# Patient Record
Sex: Female | Born: 1980 | Race: White | Hispanic: No | Marital: Married | Smoking: Former smoker
Health system: Southern US, Community
[De-identification: ages and names within clinical notes are randomized; demographics above are authoritative.]

## PROBLEM LIST (undated history)

## (undated) DIAGNOSIS — D509 Iron deficiency anemia, unspecified: Secondary | ICD-10-CM

## (undated) DIAGNOSIS — M779 Enthesopathy, unspecified: Secondary | ICD-10-CM

## (undated) DIAGNOSIS — R000 Tachycardia, unspecified: Secondary | ICD-10-CM

## (undated) DIAGNOSIS — R079 Chest pain, unspecified: Secondary | ICD-10-CM

## (undated) DIAGNOSIS — N920 Excessive and frequent menstruation with regular cycle: Secondary | ICD-10-CM

## (undated) DIAGNOSIS — M545 Low back pain, unspecified: Secondary | ICD-10-CM

## (undated) DIAGNOSIS — E041 Nontoxic single thyroid nodule: Secondary | ICD-10-CM

## (undated) HISTORY — DX: Iron deficiency anemia, unspecified: D50.9

## (undated) HISTORY — PX: BUNIONECTOMY: SHX129

## (undated) HISTORY — DX: Excessive and frequent menstruation with regular cycle: N92.0

## (undated) HISTORY — DX: Low back pain, unspecified: M54.50

## (undated) HISTORY — PX: VAGINAL HYSTERECTOMY: SUR661

## (undated) HISTORY — DX: Chest pain, unspecified: R07.9

## (undated) HISTORY — PX: LASIK: SHX215

## (undated) HISTORY — DX: Enthesopathy, unspecified: M77.9

## (undated) HISTORY — PX: LAPAROSCOPIC ASSISTED VAGINAL HYSTERECTOMY: SHX5398

## (undated) HISTORY — DX: Nontoxic single thyroid nodule: E04.1

## (undated) HISTORY — DX: Tachycardia, unspecified: R00.0

---

## 1999-06-03 ENCOUNTER — Other Ambulatory Visit: Admission: RE | Admit: 1999-06-03 | Discharge: 1999-06-03 | Payer: Self-pay | Admitting: *Deleted

## 2000-10-05 ENCOUNTER — Other Ambulatory Visit: Admission: RE | Admit: 2000-10-05 | Discharge: 2000-10-05 | Payer: Self-pay | Admitting: Obstetrics and Gynecology

## 2001-04-06 ENCOUNTER — Inpatient Hospital Stay (HOSPITAL_COMMUNITY): Admission: AD | Admit: 2001-04-06 | Discharge: 2001-04-08 | Payer: Self-pay | Admitting: Obstetrics and Gynecology

## 2001-04-14 ENCOUNTER — Inpatient Hospital Stay (HOSPITAL_COMMUNITY): Admission: AD | Admit: 2001-04-14 | Discharge: 2001-04-15 | Payer: Self-pay | Admitting: Obstetrics and Gynecology

## 2002-09-10 ENCOUNTER — Other Ambulatory Visit: Admission: RE | Admit: 2002-09-10 | Discharge: 2002-09-10 | Payer: Self-pay | Admitting: Obstetrics and Gynecology

## 2002-11-17 ENCOUNTER — Other Ambulatory Visit: Admission: RE | Admit: 2002-11-17 | Discharge: 2002-11-17 | Payer: Self-pay | Admitting: Obstetrics and Gynecology

## 2003-10-08 ENCOUNTER — Other Ambulatory Visit: Admission: RE | Admit: 2003-10-08 | Discharge: 2003-10-08 | Payer: Self-pay | Admitting: Obstetrics and Gynecology

## 2004-04-14 ENCOUNTER — Other Ambulatory Visit: Admission: RE | Admit: 2004-04-14 | Discharge: 2004-04-14 | Payer: Self-pay | Admitting: Obstetrics and Gynecology

## 2004-08-29 ENCOUNTER — Other Ambulatory Visit: Admission: RE | Admit: 2004-08-29 | Discharge: 2004-08-29 | Payer: Self-pay | Admitting: Obstetrics and Gynecology

## 2004-11-13 ENCOUNTER — Inpatient Hospital Stay (HOSPITAL_COMMUNITY): Admission: AD | Admit: 2004-11-13 | Discharge: 2004-11-13 | Payer: Self-pay | Admitting: Obstetrics and Gynecology

## 2005-04-21 ENCOUNTER — Inpatient Hospital Stay (HOSPITAL_COMMUNITY): Admission: RE | Admit: 2005-04-21 | Discharge: 2005-04-24 | Payer: Self-pay | Admitting: Obstetrics and Gynecology

## 2005-05-01 ENCOUNTER — Inpatient Hospital Stay (HOSPITAL_COMMUNITY): Admission: AD | Admit: 2005-05-01 | Discharge: 2005-05-01 | Payer: Self-pay | Admitting: Obstetrics and Gynecology

## 2005-06-01 ENCOUNTER — Other Ambulatory Visit: Admission: RE | Admit: 2005-06-01 | Discharge: 2005-06-01 | Payer: Self-pay | Admitting: Obstetrics and Gynecology

## 2008-10-01 ENCOUNTER — Encounter: Admission: RE | Admit: 2008-10-01 | Discharge: 2008-10-01 | Payer: Self-pay | Admitting: Family Medicine

## 2009-10-21 ENCOUNTER — Encounter: Admission: RE | Admit: 2009-10-21 | Discharge: 2009-10-21 | Payer: Self-pay | Admitting: Internal Medicine

## 2010-05-27 ENCOUNTER — Other Ambulatory Visit: Payer: Self-pay | Admitting: Endocrinology

## 2010-05-27 DIAGNOSIS — E049 Nontoxic goiter, unspecified: Secondary | ICD-10-CM

## 2010-08-05 NOTE — Discharge Summary (Signed)
Sydney Ramsey, Sydney Ramsey              ACCOUNT NO.:  [id number]   MEDICAL RECORD NO.:  [id number]          PATIENT TYPE:  INP   LOCATION:  9115                          FACILITY:  [facility]   PHYSICIAN:  Freddy Finner, M.D.   DATE OF BIRTH:  [birth year]   DATE OF ADMISSION:  04/21/2005  DATE OF DISCHARGE:  04/24/2005                                 DISCHARGE SUMMARY   ADMISSION DIAGNOSES:  1.  Pregnancy at term,  2.  Breech presentation.   DISCHARGE DIAGNOSES:  1.  Status post low transverse cesarean section,  2.  A viable female infant.   PROCEDURE:  Primary low transverse cesarean section.   REASON FOR ADMISSION:  Please see dictated H&P.   HOSPITAL COURSE:  The patient is a 30 year old white married female, gravida  2, para 1, that was admitted to [facility] for scheduled  cesarean section. The patient has had a breech presentation which was  confirmed by ultrasound on admission. On the morning of admission the  patient was taken to operating room where spinal anesthesia was administered  without difficulty. A low transverse incision was made with delivery of a  viable female infant weighing 6 pounds, 4 ounces. Apgar's of 8 at 1 minute  and 9 at 5 minutes. Arterial cord pH was 7.28. The patient tolerated  procedure well and was taken to the recovery room in stable condition. On  postoperative day #1 the patient was without complaint. Vital signs were  stable. She was afebrile. Abdomen soft. Fundus firm and nontender, abdominal  dressing was noted to be clean, dry and intact.   LABORATORY DATA:  Hemoglobin 7.2, platelet count 143,000, WBC count of 7.5  thousand.   On postoperative day #2 the patient was without complaint. Vital signs  remained stable. She was afebrile. Abdomen soft with good return of bowel  function. Fundus firm and nontender. Incision was clean, dry and intact. The  patient was tolerating a regular diet without complaints of nausea and  vomiting.  The patient was now started on some iron supplementation. On  postoperative day #3 the patient was without complaint. Vital signs remained  stable. Fundus firm and nontender. Incision was clean, dry and intact,  staples removed and the patient was discharged home.   CONDITION ON DISCHARGE:  Stable diet regular as tolerated.   ACTIVITY:  No heavy lifting, no driving x2 weeks, no vaginal entry.   FOLLOW UP:  Patient to follow up in the office in 1-2 weeks for incision  check. She is to call for temperature greater than 100 degrees persistent,  vomiting, heavy vaginal bleeding and/or redness or drainage from incisional  site.   DISCHARGE MEDICATIONS:  1.  Percocet 5/325 #30 one p.o. q.4-6 hours p.r.n. pain.  2.  Motrin 600 mg q.6 hours.  3.  Reglan 10 mg one p.o. t.i.d. x7 days for increase in milk supply.  4.  Prenatal vitamins one p.o. daily.      Julio Sicks, N.P.      Freddy Finner, M.D.  Electronically Signed    CC/MEDQ  D:  05/30/2005  T:  05/30/2005  Job:  [id number]

## 2010-08-05 NOTE — Discharge Summary (Signed)
[facility]  Patient:    Sydney Ramsey, Sydney Ramsey Visit Number: [id number] MRN: [id number]          Service Type: GYN Location: 910A 9116 [id number] Attending Physician:  Frederich Balding Dictated by:   Marcelle Overlie, M.D. Admit Date:  04/13/2001 Discharge Date: 04/15/2001                             Discharge Summary  ADMISSION DIAGNOSIS:  Endomyometritis postpartum.  DISCHARGE DIAGNOSIS:  Endomyometritis postpartum.  HISTORY OF PRESENT ILLNESS AND HOSPITAL COURSE:  The patient is a 30 year old gravida 1, para 1, status post vaginal delivery on 04/06/01, who presented to the office complaining of fever and abdominal pain and foul discharge.  On admission, the patients temperature was 100.2.  She was noted to have moderate uterine tenderness, and a white blood cell count was 15.7 with a left shift.  The patient was admitted, and placed on IV Unasyn.  Within two days the patients pain had resolved.  She had been afebrile for over 24 hours. The patient was discharged home on hospital day #3, with Augmentin 250 mg t.i.d. x7 days.  CONDITION ON DISCHARGE:  Good.  She was advised to call if she has any temperature greater than 100.5, or return of her abdominal pain.  FOLLOWUP:  In our office for her postpartum visit. Dictated by:   Marcelle Overlie, M.D. Attending Physician:  Frederich Balding DD:  04/29/01 TD:  04/29/01 Job: [id number]

## 2010-08-05 NOTE — H&P (Signed)
NAMEJENIN, [name], M.D. DATE OF BIRTH:  Feb 06, 1981   DATE OF ADMISSION:  04/21/2005  DATE OF DISCHARGE:                                HISTORY & PHYSICAL   CHIEF COMPLAINT:  Breech.   HISTORY OF PRESENT ILLNESS:  This patient is a 30 year old, married white  female, G2, P83 with an EDC of April 29, 2005, who has a breech baby,  confirmed on ultrasound.  Prenatal care has been complicated by a two-vessel  cord.  Anatomy was otherwise normal.  She has a history of a degenerated  disk as well.  After discussing the options, she is being admitted for  cesarean section.  Ultrasound in the office on April 05, 2005, was  consistent with breech and an estimated fetal weight of 5 pounds 15 ounces  and amniotic fluid index of 12.3 in the 40th percentile.  Potential risks  and complications have been reviewed preoperatively.   Past medical history, past surgical history, family history, obstetric  history, see prenatal history and physical.   MEDICATIONS:  Prenatal vitamins, no known drug allergies.   SOCIAL HISTORY:  Denies tobacco, alcohol, or drug abuse.   REVIEW OF SYSTEMS:  NEUROLOGIC:  Denies headache.  CARDIAC:  Denies chest  pain.  PULMONARY:  Denies shortness of breath.   PHYSICAL EXAMINATION:  VITAL SIGNS:  Height 5 feet 3 inches, weight 198  pounds, blood pressure 120/74.  HEENT:  Without thyromegaly.  LUNGS:  Clear to auscultation.  HEART:  Regular rate and rhythm.  BREASTS:  Not examined.  ABDOMEN:  Gravid, nontender.  PELVIC:  Cervix is 1, thick and high, breech.  EXTREMITIES:  Grossly within normal limits.  NEUROLOGIC EXAM:  Grossly within normal limits.   ASSESSMENT:  Intrauterine pregnancy at 38-6/7 weeks, breech.   PLAN:  Cesarean section.      Guy Sandifer Henderson Cloud,  M.D.  Electronically Signed    JET/MEDQ  D:  04/19/2005  T:  04/19/2005  Job:  [id number]

## 2010-08-05 NOTE — Op Note (Signed)
NAMESHARRA, Sydney Ramsey              ACCOUNT NO.:  [id number]   MEDICAL RECORD NO.:  [id number]          PATIENT TYPE:  INP   LOCATION:  9199                          [facility]ACILITY:  [facility]   PHYSICIAN:  Guy Sandifer. Henderson Cloud, M.D. DATE OF BIRTH:  Sep 06, 1980   DATE OF PROCEDURE:  04/21/2005  DATE OF DISCHARGE:                                 OPERATIVE REPORT   PREOPERATIVE DIAGNOSIS:  Breech, at term.   POSTOPERATIVE DIAGNOSIS:  Breech, at term.   PROCEDURE:  Low transverse cesarean section.   SURGEON:  Guy Sandifer. Henderson Cloud, M.D.   ANESTHESIA:  Spinal.   ESTIMATED BLOOD LOSS:  800 mL.   FINDINGS:  A viable female infant, Apgars of 8 and 9 at one and five  minutes, respectively.  Birth weight pending. Arterial cord pH 7.28.   INDICATIONS AND CONSENT:  This patient is a 30 year old married white  female, G2, P24, with an EDC of April 29, 2005, with breech confirmed on  ultrasound.  Cesarean section has been discussed.  The potential risks and  complications have been discussed preoperatively including but limited to  infection, bowel, bladder or ureteral damage, bleeding requiring transfusion  of blood products with possible transfusion reaction, HIV and hepatitis  acquisition, DVT, PE, and pneumonia.  All questions have been answered and  consent is signed on the chart.   PROCEDURE:  The patient is taken to operating room, where she is identified,  a spinal anesthetic is placed, and she is placed in the dorsal supine  position with a 15 degrees left lateral wedge.  She was then prepped, a  Foley catheter was placed in the bladder to drain, and  she is draped in a  sterile fashion.  After testing for adequate spinal anesthesia, the skin is  entered through a Pfannenstiel incision.  Dissection is carried out layers  to the peritoneum.  The peritoneum was incised, extended superiorly and  inferiorly.  The vesicouterine peritoneum is taken down cephalolaterally and  a bladder flap is and the  bladder blade is placed.  The uterus is incised in  a low transverse manner and the uterine cavity is entered bluntly with a  hemostat.  The uterine incision is then extended cephalolaterally with the  fingers.  The breech is delivered and the remainder the baby is delivered  without difficulty.  There is a single nuchal cord. T A good cry and tone is  noted.  The cord is clamped and cut and the baby is handed to the awaiting  pediatrics team.  Placenta is manually delivered. The uterus is clean.  There is a small extension in the lower uterine segment to the left of the  midline.  This is repaired with 0 Monocryl in a locking fashion.  The  uterine incision is then closed in a running locking suture with an  imbricating layer placed as well.  There is a small bleeder at the distal  point of the lower uterine incision extension.  With a hand placed behind  the broad ligament for palpation, a single 2-0 chromic suture was placed,  which obtained good  hemostasis.  The anterior peritoneum is then closed in a  running fashion with 0 Monocryl suture, which is also used to reapproximate  the pyramidalis muscle in  the midline.  The fascia is closed in a running fashion with 0 PDS suture  and the skin is closed with clips.  All sponge, instrument and needle counts  are correct, and the patient is transferred to the recovery room in stable  condition.      Guy Sandifer Henderson Cloud, M.D.  Electronically Signed     JET/MEDQ  D:  04/21/2005  T:  04/21/2005  Job:  [id number]

## 2010-09-29 ENCOUNTER — Other Ambulatory Visit: Payer: Self-pay | Admitting: Obstetrics and Gynecology

## 2010-09-30 ENCOUNTER — Ambulatory Visit
Admission: RE | Admit: 2010-09-30 | Discharge: 2010-09-30 | Disposition: A | Discharge status: Home / Self Care | Payer: Private Health Insurance - Indemnity | Source: Ambulatory Visit | Attending: Obstetrics and Gynecology | Admitting: Obstetrics and Gynecology

## 2010-11-22 ENCOUNTER — Other Ambulatory Visit: Payer: Self-pay

## 2010-11-23 ENCOUNTER — Ambulatory Visit
Admission: RE | Admit: 2010-11-23 | Discharge: 2010-11-23 | Disposition: A | Discharge status: Home / Self Care | Payer: Private Health Insurance - Indemnity | Source: Ambulatory Visit | Attending: Endocrinology | Admitting: Endocrinology

## 2010-11-23 DIAGNOSIS — E049 Nontoxic goiter, unspecified: Secondary | ICD-10-CM

## 2012-07-09 ENCOUNTER — Other Ambulatory Visit: Payer: Self-pay | Admitting: Endocrinology

## 2012-07-09 DIAGNOSIS — E049 Nontoxic goiter, unspecified: Secondary | ICD-10-CM

## 2012-07-10 ENCOUNTER — Ambulatory Visit
Admission: RE | Admit: 2012-07-10 | Discharge: 2012-07-10 | Disposition: A | Discharge status: Home / Self Care | Payer: BC Managed Care – PPO | Source: Ambulatory Visit | Attending: Endocrinology | Admitting: Endocrinology

## 2012-07-10 DIAGNOSIS — E049 Nontoxic goiter, unspecified: Secondary | ICD-10-CM

## 2013-06-13 ENCOUNTER — Ambulatory Visit (INDEPENDENT_AMBULATORY_CARE_PROVIDER_SITE_OTHER): Payer: 59 | Admitting: Family Medicine

## 2013-06-13 ENCOUNTER — Telehealth: Payer: Self-pay | Admitting: Family Medicine

## 2013-06-13 ENCOUNTER — Encounter: Payer: Self-pay | Admitting: Family Medicine

## 2013-06-13 VITALS — BP 106/69 | HR 70 | Temp 99.0°F | Resp 16 | Ht 63.0 in | Wt 164.0 lb

## 2013-06-13 DIAGNOSIS — Z0000 Encounter for general adult medical examination without abnormal findings: Secondary | ICD-10-CM

## 2013-06-13 LAB — COMPREHENSIVE METABOLIC PANEL
ALK PHOS: 45 U/L (ref 39–117)
ALT: 17 U/L (ref 0–35)
AST: 17 U/L (ref 0–37)
Albumin: 4.2 g/dL (ref 3.5–5.2)
BILIRUBIN TOTAL: 0.5 mg/dL (ref 0.3–1.2)
BUN: 16 mg/dL (ref 6–23)
CHLORIDE: 104 meq/L (ref 96–112)
CO2: 25 meq/L (ref 19–32)
Calcium: 9.6 mg/dL (ref 8.4–10.5)
Creatinine, Ser: 0.8 mg/dL (ref 0.4–1.2)
GFR: 85.98 mL/min (ref >60.00)
GLUCOSE: 86 mg/dL (ref 70–99)
POTASSIUM: 4.1 meq/L (ref 3.5–5.1)
SODIUM: 135 meq/L (ref 135–145)
TOTAL PROTEIN: 7.3 g/dL (ref 6.0–8.3)

## 2013-06-13 LAB — CBC WITH DIFFERENTIAL/PLATELET
BASOS PCT: 0.5 % (ref 0.0–3.0)
Basophils Absolute: 0 10*3/uL (ref 0.0–0.1)
EOS PCT: 1.9 % (ref 0.0–5.0)
Eosinophils Absolute: 0.1 10*3/uL (ref 0.0–0.7)
HEMATOCRIT: 34.7 % — AB (ref 36.0–46.0)
Hemoglobin: 11.4 g/dL — ABNORMAL LOW (ref 12.0–15.0)
Lymphocytes Relative: 31.5 % (ref 12.0–46.0)
Lymphs Abs: 1.7 10*3/uL (ref 0.7–4.0)
MCHC: 32.8 g/dL (ref 30.0–36.0)
MCV: 76.6 fl — AB (ref 78.0–100.0)
MONO ABS: 0.4 10*3/uL (ref 0.1–1.0)
Monocytes Relative: 7.5 % (ref 3.0–12.0)
Neutro Abs: 3.1 10*3/uL (ref 1.4–7.7)
Neutrophils Relative %: 58.6 % (ref 43.0–77.0)
Platelets: 219 10*3/uL (ref 150.0–400.0)
RBC: 4.52 Mil/uL (ref 3.87–5.11)
RDW: 14.9 % — ABNORMAL HIGH (ref 11.5–14.6)
WBC: 5.3 10*3/uL (ref 4.5–10.5)

## 2013-06-13 LAB — LIPID PANEL
CHOL/HDL RATIO: 3
Cholesterol: 173 mg/dL (ref 0–200)
HDL: 53.1 mg/dL (ref >39.00)
LDL CALC: 108 mg/dL — AB (ref 0–99)
TRIGLYCERIDES: 61 mg/dL (ref 0.0–149.0)
VLDL: 12.2 mg/dL (ref 0.0–40.0)

## 2013-06-13 LAB — TSH: TSH: 0.81 u[IU]/mL (ref 0.35–5.50)

## 2013-06-13 MED ORDER — TAZAROTENE 0.05 % EX CREA: TOPICAL_CREAM | CUTANEOUS | Status: DC

## 2013-06-13 NOTE — Telephone Encounter (Signed)
Left message to tell patient medication was sent to [facility].

## 2013-06-13 NOTE — Telephone Encounter (Signed)
Patient forgot to ask you if you could refill her acne / face cream which is generic tazorac 0.05% in a 30 gram tube.  Patient uses cream BID.  Please advise.

## 2013-06-13 NOTE — Telephone Encounter (Signed)
Rx sent to pharmacy (this comes in brand name only).

## 2013-06-13 NOTE — Progress Notes (Signed)
Office Note 06/13/2013  CC:  Chief Complaint  Patient presents with  . Establish Care    fasting     HPI:  Sydney Ramsey is a 33 y.o. White female who is here to establish care. Patient's most recent primary MD: Brooks Sailors in [place]--has been a while. ENdo is Dr. Talmage Nap.  GYN is [name]. Old records were reviewed prior to or during today's visit.  Last CPE about 2 yrs ago--all labs were ok then. Feeling well now.  Past Medical History  Diagnosis Date  . Thyroid nodule     Has been euthyroid  . Iron deficiency anemia     Hx of; has heavy menses.  . Menorrhagia     [name]    Past Surgical History  Procedure Laterality Date  . Bunionectomy Left   . Cesarean section      Family History  Problem Relation Age of Onset  . Alzheimer's disease Paternal Grandmother   . Heart attack Paternal Grandfather     History   Social History  . Marital Status: Married    Spouse Name: N/A    Number of Children: N/A  . Years of Education: N/A   Occupational History  . Not on file.   Social History Main Topics  . Smoking status: Former Smoker    Quit date: 06/14/2010  . Smokeless tobacco: Never Used  . Alcohol Use: 1.2 oz/week    2 Cans of beer per week     Comment: twice weekly  . Drug Use: No  . Sexual Activity: Not on file   Other Topics Concern  . Not on file   Social History Narrative   Married, 1 son 12 yrs and 1 daughter 81yrs.   Occupation: stay at home mom.   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   Lives in [place].   Tob: 5 pack-yr hx, quit 2012.   Alc: occasional beer.   No drugs.   Exercise: 3-4 days a week : running and/or aerobics.    Outpatient Encounter Prescriptions as of 06/13/2013  Medication Sig  . levothyroxine (SYNTHROID, LEVOTHROID) 100 MCG tablet Take 100 mcg by mouth daily before breakfast.    No Known Allergies  ROS Review of Systems  Constitutional: Negative for fever, chills, appetite change  and fatigue.  HENT: Negative for congestion, dental problem, ear pain and sore throat.   Eyes: Negative for discharge, redness and visual disturbance.  Respiratory: Negative for cough, chest tightness, shortness of breath and wheezing.   Cardiovascular: Negative for chest pain, palpitations and leg swelling.  Gastrointestinal: Negative for nausea, vomiting, abdominal pain, diarrhea and blood in stool.  Genitourinary: Negative for dysuria, urgency, frequency, hematuria, flank pain and difficulty urinating.  Musculoskeletal: Negative for arthralgias, back pain, joint swelling, myalgias and neck stiffness.  Skin: Negative for pallor and rash.  Neurological: Negative for dizziness, speech difficulty, weakness and headaches.  Hematological: Negative for adenopathy. Does not bruise/bleed easily.  Psychiatric/Behavioral: Negative for confusion and sleep disturbance. The patient is not nervous/anxious.     PE; Blood pressure 106/69, pulse 70, temperature 99 F (37.2 C), temperature source Temporal, resp. rate 16, height 5\' 3"  (1.6 m), weight 164 lb (74.39 kg), last menstrual period 05/22/2013, SpO2 98.00%. Gen: Alert, well appearing.  Patient is oriented to person, place, time, and situation. AFFECT: pleasant, lucid thought and speech. ENT: Ears: EACs clear, normal epithelium.  TMs with good light reflex and landmarks bilaterally.  Eyes: no injection,  icteris, swelling, or exudate.  EOMI, PERRLA. Nose: no drainage or turbinate edema/swelling.  No injection or focal lesion.  Mouth: lips without lesion/swelling.  Oral mucosa pink and moist.  Dentition intact and without obvious caries or gingival swelling.  Oropharynx without erythema, exudate, or swelling.  Neck: supple/nontender.  No LAD, mass, or TM.  Carotid pulses 2+ bilaterally, without bruits. CV: RRR, no m/r/g.   LUNGS: CTA bilat, nonlabored resps, good aeration in all lung fields. ABD: soft, NT, ND, BS normal.  No hepatospenomegaly or mass.   No bruits. EXT: no clubbing, cyanosis, or edema.  Musculoskeletal: no joint swelling, erythema, warmth, or tenderness.  ROM of all joints intact. Skin - no sores or suspicious lesions or rashes or color changes  Pertinent labs:  None today  ASSESSMENT AND PLAN:   New pt: no old records to obtain.  Health maintenance examination Reviewed age and gender appropriate health maintenance issues (prudent diet, regular exercise, health risks of tobacco and excessive alcohol, use of seatbelts, fire alarms in home, use of sunscreen).  Also reviewed age and gender appropriate health screening as well as vaccine recommendations. She is UTD with vaccines, breast ca screening, and cervical ca screening (through GYN). Health panel labs drawn today.   An After Visit Summary was printed and given to the patient.  Return in about 1 year (around 06/14/2014) for annual CPE with fasting labs the week prior.

## 2013-06-13 NOTE — Assessment & Plan Note (Signed)
Reviewed age and gender appropriate health maintenance issues (prudent diet, regular exercise, health risks of tobacco and excessive alcohol, use of seatbelts, fire alarms in home, use of sunscreen).  Also reviewed age and gender appropriate health screening as well as vaccine recommendations. She is UTD with vaccines, breast ca screening, and cervical ca screening (through GYN). Health panel labs drawn today.

## 2013-07-03 ENCOUNTER — Encounter: Payer: Self-pay | Admitting: Family Medicine

## 2013-07-03 ENCOUNTER — Ambulatory Visit (HOSPITAL_BASED_OUTPATIENT_CLINIC_OR_DEPARTMENT_OTHER)
Admission: RE | Admit: 2013-07-03 | Discharge: 2013-07-03 | Disposition: A | Discharge status: Home / Self Care | Payer: 59 | Source: Ambulatory Visit | Attending: Family Medicine | Admitting: Family Medicine

## 2013-07-03 ENCOUNTER — Ambulatory Visit (INDEPENDENT_AMBULATORY_CARE_PROVIDER_SITE_OTHER): Payer: 59 | Admitting: Family Medicine

## 2013-07-03 VITALS — BP 116/79 | HR 61 | Temp 98.8°F | Resp 18 | Ht 63.0 in | Wt 164.0 lb

## 2013-07-03 DIAGNOSIS — R0789 Other chest pain: Secondary | ICD-10-CM

## 2013-07-03 DIAGNOSIS — R5383 Other fatigue: Secondary | ICD-10-CM

## 2013-07-03 DIAGNOSIS — R079 Chest pain, unspecified: Secondary | ICD-10-CM

## 2013-07-03 DIAGNOSIS — R5381 Other malaise: Secondary | ICD-10-CM

## 2013-07-03 LAB — TSH: TSH: 0.43 u[IU]/mL (ref 0.35–5.50)

## 2013-07-03 LAB — CBC WITH DIFFERENTIAL/PLATELET
Basophils Absolute: 0 10*3/uL (ref 0.0–0.1)
Basophils Relative: 0.8 % (ref 0.0–3.0)
EOS ABS: 0.1 10*3/uL (ref 0.0–0.7)
EOS PCT: 1.5 % (ref 0.0–5.0)
HCT: 35.7 % — ABNORMAL LOW (ref 36.0–46.0)
Hemoglobin: 11.7 g/dL — ABNORMAL LOW (ref 12.0–15.0)
LYMPHS PCT: 30.1 % (ref 12.0–46.0)
Lymphs Abs: 1.7 10*3/uL (ref 0.7–4.0)
MCHC: 32.7 g/dL (ref 30.0–36.0)
MCV: 76.6 fl — ABNORMAL LOW (ref 78.0–100.0)
MONO ABS: 0.3 10*3/uL (ref 0.1–1.0)
MONOS PCT: 6 % (ref 3.0–12.0)
NEUTROS PCT: 61.6 % (ref 43.0–77.0)
Neutro Abs: 3.6 10*3/uL (ref 1.4–7.7)
PLATELETS: 246 10*3/uL (ref 150.0–400.0)
RBC: 4.65 Mil/uL (ref 3.87–5.11)
RDW: 14.9 % — AB (ref 11.5–14.6)
WBC: 5.8 10*3/uL (ref 4.5–10.5)

## 2013-07-03 LAB — COMPREHENSIVE METABOLIC PANEL
ALBUMIN: 3.8 g/dL (ref 3.5–5.2)
ALK PHOS: 52 U/L (ref 39–117)
ALT: 18 U/L (ref 0–35)
AST: 19 U/L (ref 0–37)
BUN: 10 mg/dL (ref 6–23)
CALCIUM: 9.4 mg/dL (ref 8.4–10.5)
CO2: 30 mEq/L (ref 19–32)
CREATININE: 0.7 mg/dL (ref 0.4–1.2)
Chloride: 103 mEq/L (ref 96–112)
GFR: 101.72 mL/min (ref >60.00)
Glucose, Bld: 78 mg/dL (ref 70–99)
POTASSIUM: 3.9 meq/L (ref 3.5–5.1)
SODIUM: 139 meq/L (ref 135–145)
TOTAL PROTEIN: 7.2 g/dL (ref 6.0–8.3)
Total Bilirubin: 0.8 mg/dL (ref 0.3–1.2)

## 2013-07-03 NOTE — Progress Notes (Signed)
OFFICE NOTE  07/03/2013  CC:  Chief Complaint  Patient presents with  . Chest Pain    while running   . Fatigue  . Nausea     HPI: Patient is a 33 y.o. Caucasian female who is here for chest pain.  "I haven't felt well for a couple of weeks. Last week had episode of central chest squeezing while running on treadmill, went away when she started walking.  Happened again when running with dog.  Again, this week happened when running again.  No nausea or diaphoresis.   Achy in chest afterwards.  Out of breath going up stairs recently.  Rested for a day or two.  Felt better yesterday.  Woke up today felt a bit queezy, ate a banana and drank coffee but no distinct nausea and no vomiting, also some pain under right ribs.  Legs felt weak.  She got somewhat tremulous.  Felt some palp's today but none during recent exercise induced chest pains.  Lots more perspiration under arms lately.  The chest pains have occurred after exercising approx 10 min or so.  When she slowed down, the discomfort went away after 1-2 min.  Noted no wheezing.  Tightness in chest was noted but wasn't unusually SOB.  Denies hx of EIB or asthmatic sx's in past.  No recent coughing, no fever, no persistent HAs.  No significant allergic rhinitis.    Pertinent PMH:  Past Medical History  Diagnosis Date  . Thyroid nodule     Has been euthyroid  . Iron deficiency anemia     Hx of; has heavy menses.  . Menorrhagia     Dr. Henderson Cloudomblin   Past Surgical History  Procedure Laterality Date  . Bunionectomy Left   . Cesarean section     History   Social History Narrative   Married, 1 son 12 yrs and 1 daughter 5060yrs.   Occupation: stay at home mom.   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   Lives in [place].   Tob: 5 pack-yr hx, quit 2012.   Alc: occasional beer.   No drugs.   Exercise: 3-4 days a week : running and/or aerobics.    MEDS:  Outpatient Prescriptions Prior to Visit  Medication Sig  Dispense Refill  . levothyroxine (SYNTHROID, LEVOTHROID) 100 MCG tablet Take 100 mcg by mouth daily before breakfast.      . tazarotene (TAZORAC) 0.05 % cream Apply to affected area bid  60 g  5   No facility-administered medications prior to visit.    PE: Blood pressure 116/79, pulse 61, temperature 98.8 F (37.1 C), temperature source Temporal, resp. rate 18, height 5\' 3"  (1.6 m), weight 164 lb (74.39 kg), last menstrual period 06/17/2013, SpO2 99.00%. Gen: Alert, well appearing.  Patient is oriented to person, place, time, and situation. ZOX:WRUEENT:Eyes: no injection, icteris, swelling, or exudate.  EOMI, PERRLA. Mouth: lips without lesion/swelling.  Oral mucosa pink and moist. Oropharynx without erythema, exudate, or swelling.  Neck - No masses or limitation in range of motion.  She has diffuse thyromegaly w/out tenderness  (c/w previous exams/dx) CV: RRR, no m/r/g.   LUNGS: CTA bilat, nonlabored resps, good aeration in all lung fields. No chest wall tenderness. ABD: soft, NT, ND, BS normal.  No hepatospenomegaly or mass.  No bruits. EXT: no clubbing, cyanosis, or edema.   LAB: 12 lead EKG today showed sinus bradycardia, rate 56, nonspecific T wave changes. No prior EKG available for comparison.  IMPRESSION  AND PLAN:  Atypical chest pain W/u here reassuring. Check CBC, CMET, TSH, and CXR. Working dx at this time is exercise induced bronchospasm.  No therapy recommended at this time, though. Watchful waiting approach discussed with pt.   An After Visit Summary was printed and given to the patient.  FOLLOW UP: prn

## 2013-07-03 NOTE — Assessment & Plan Note (Signed)
W/u here reassuring. Check CBC, CMET, TSH, and CXR. Working dx at this time is exercise induced bronchospasm.  No therapy recommended at this time, though. Watchful waiting approach discussed with pt.

## 2013-07-03 NOTE — Progress Notes (Signed)
Pre visit review using our clinic review tool, if applicable. No additional management support is needed unless otherwise documented below in the visit note. 

## 2013-07-04 ENCOUNTER — Ambulatory Visit: Payer: 59

## 2013-07-04 DIAGNOSIS — D509 Iron deficiency anemia, unspecified: Secondary | ICD-10-CM

## 2013-07-04 LAB — FERRITIN: FERRITIN: 6.4 ng/mL — AB (ref 10.0–291.0)

## 2013-07-04 LAB — IBC PANEL
IRON: 67 ug/dL (ref 42–145)
SATURATION RATIOS: 13 % — AB (ref 20.0–50.0)
Transferrin: 368.9 mg/dL — ABNORMAL HIGH (ref 212.0–360.0)

## 2013-07-07 ENCOUNTER — Encounter: Payer: Self-pay | Admitting: Family Medicine

## 2013-09-15 ENCOUNTER — Other Ambulatory Visit: Payer: Self-pay | Admitting: Endocrinology

## 2013-09-15 DIAGNOSIS — E049 Nontoxic goiter, unspecified: Secondary | ICD-10-CM

## 2013-09-17 ENCOUNTER — Ambulatory Visit
Admission: RE | Admit: 2013-09-17 | Discharge: 2013-09-17 | Disposition: A | Discharge status: Home / Self Care | Payer: 59 | Source: Ambulatory Visit | Attending: Endocrinology | Admitting: Endocrinology

## 2013-09-17 DIAGNOSIS — E049 Nontoxic goiter, unspecified: Secondary | ICD-10-CM

## 2014-03-20 HISTORY — PX: LAPAROSCOPIC ASSISTED VAGINAL HYSTERECTOMY: SHX5398

## 2014-06-15 ENCOUNTER — Encounter: Payer: 59 | Admitting: Family Medicine

## 2014-06-30 ENCOUNTER — Other Ambulatory Visit: Payer: Self-pay | Admitting: Obstetrics and Gynecology

## 2014-06-30 DIAGNOSIS — R928 Other abnormal and inconclusive findings on diagnostic imaging of breast: Secondary | ICD-10-CM

## 2014-07-03 ENCOUNTER — Other Ambulatory Visit: Payer: Self-pay | Admitting: Obstetrics and Gynecology

## 2014-07-03 ENCOUNTER — Ambulatory Visit
Admission: RE | Admit: 2014-07-03 | Discharge: 2014-07-03 | Disposition: A | Discharge status: Home / Self Care | Payer: BLUE CROSS/BLUE SHIELD | Source: Ambulatory Visit | Attending: Obstetrics and Gynecology | Admitting: Obstetrics and Gynecology

## 2014-07-03 ENCOUNTER — Other Ambulatory Visit: Payer: 59

## 2014-07-03 DIAGNOSIS — R928 Other abnormal and inconclusive findings on diagnostic imaging of breast: Secondary | ICD-10-CM

## 2014-07-07 ENCOUNTER — Ambulatory Visit (INDEPENDENT_AMBULATORY_CARE_PROVIDER_SITE_OTHER): Payer: BLUE CROSS/BLUE SHIELD | Admitting: Family Medicine

## 2014-07-07 ENCOUNTER — Encounter: Payer: Self-pay | Admitting: Family Medicine

## 2014-07-07 VITALS — BP 111/74 | HR 65 | Temp 98.8°F | Resp 18 | Ht 63.0 in | Wt 169.0 lb

## 2014-07-07 DIAGNOSIS — D509 Iron deficiency anemia, unspecified: Secondary | ICD-10-CM | POA: Diagnosis not present

## 2014-07-07 DIAGNOSIS — Z0000 Encounter for general adult medical examination without abnormal findings: Secondary | ICD-10-CM | POA: Diagnosis not present

## 2014-07-07 LAB — COMPREHENSIVE METABOLIC PANEL
ALBUMIN: 4.2 g/dL (ref 3.5–5.2)
ALT: 15 U/L (ref 0–35)
AST: 16 U/L (ref 0–37)
Alkaline Phosphatase: 49 U/L (ref 39–117)
BUN: 13 mg/dL (ref 6–23)
CALCIUM: 9.1 mg/dL (ref 8.4–10.5)
CHLORIDE: 105 meq/L (ref 96–112)
CO2: 28 meq/L (ref 19–32)
CREATININE: 0.71 mg/dL (ref 0.40–1.20)
GFR: 99.48 mL/min (ref >60.00)
Glucose, Bld: 81 mg/dL (ref 70–99)
POTASSIUM: 4.1 meq/L (ref 3.5–5.1)
Sodium: 137 mEq/L (ref 135–145)
TOTAL PROTEIN: 6.7 g/dL (ref 6.0–8.3)
Total Bilirubin: 0.7 mg/dL (ref 0.2–1.2)

## 2014-07-07 LAB — LIPID PANEL
Cholesterol: 150 mg/dL (ref 0–200)
HDL: 41.2 mg/dL (ref >39.00)
LDL CALC: 93 mg/dL (ref 0–99)
NONHDL: 108.8
Total CHOL/HDL Ratio: 4
Triglycerides: 78 mg/dL (ref 0.0–149.0)
VLDL: 15.6 mg/dL (ref 0.0–40.0)

## 2014-07-07 LAB — CBC WITH DIFFERENTIAL/PLATELET
BASOS PCT: 0.5 % (ref 0.0–3.0)
Basophils Absolute: 0 10*3/uL (ref 0.0–0.1)
EOS PCT: 2.9 % (ref 0.0–5.0)
Eosinophils Absolute: 0.1 10*3/uL (ref 0.0–0.7)
HCT: 39.4 % (ref 36.0–46.0)
HEMOGLOBIN: 13.3 g/dL (ref 12.0–15.0)
Lymphocytes Relative: 30.3 % (ref 12.0–46.0)
Lymphs Abs: 1.5 10*3/uL (ref 0.7–4.0)
MCHC: 33.7 g/dL (ref 30.0–36.0)
MCV: 83.8 fl (ref 78.0–100.0)
Monocytes Absolute: 0.4 10*3/uL (ref 0.1–1.0)
Monocytes Relative: 7.5 % (ref 3.0–12.0)
NEUTROS ABS: 2.9 10*3/uL (ref 1.4–7.7)
NEUTROS PCT: 58.8 % (ref 43.0–77.0)
Platelets: 201 10*3/uL (ref 150.0–400.0)
RBC: 4.7 Mil/uL (ref 3.87–5.11)
RDW: 12.6 % (ref 11.5–15.5)
WBC: 5 10*3/uL (ref 4.0–10.5)

## 2014-07-07 NOTE — Progress Notes (Signed)
Pre visit review using our clinic review tool, if applicable. No additional management support is needed unless otherwise documented below in the visit note. 

## 2014-07-07 NOTE — Progress Notes (Signed)
Office Note 07/07/2014  CC:  Chief Complaint  Patient presents with  . Annual Exam    FASTING    HPI:  Sydney Ramsey is a 34 y.o. White female who is here for annual health maintenance exam.   Is followed by Dr. Talmage Nap for her hx of thyroid nodule, is on T4 for suppression and pt says levels have been "fine" and f/u ultrasounds annually by Dr. Talmage Nap have been unchanged.  Recent lump seen on mammo/us ob R breast and she goes for bx tomorrow.  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   For her menorrhagia and hx of iron def anemia from this, she and [name] are considering a hysterectomy (endo bx normal, no fibroids present either) this summer.   Paps UTD via GYN.  Gets eye exam annually: no probs. Gets dental exams/cleaning q6 mo.   Past Medical History  Diagnosis Date  . Thyroid nodule     Has been euthyroid  . Iron deficiency anemia     Hx of; has heavy menses  (low iron/ferritin 06/2013)  . Menorrhagia     [name]    Past Surgical History  Procedure Laterality Date  . Bunionectomy Left   . Cesarean section    . Lasik  2008 approx    Family History  Problem Relation Age of Onset  . Alzheimer's disease Paternal Grandmother   . Heart attack Paternal Grandfather     History   Social History  . Marital Status: Married    Spouse Name: N/A  . Number of Children: N/A  . Years of Education: N/A   Occupational History  . Not on file.   Social History Main Topics  . Smoking status: Former Smoker    Quit date: 06/14/2010  . Smokeless tobacco: Never Used  . Alcohol Use: 1.2 oz/week    2 Cans of beer per week     Comment: twice weekly  . Drug Use: No  . Sexual Activity: Not on file   Other Topics Concern  . Not on file   Social History Narrative   Married, has 1 son and 1 daughter.   Occupation: stay at home mom.   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   Lives in [place].   Tob: 5  pack-yr hx, quit 2012.   Alc: occasional beer.   No drugs.   Exercise: 3-4 days a week : running and/or aerobics.    Outpatient Prescriptions Prior to Visit  Medication Sig Dispense Refill  . levothyroxine (SYNTHROID, LEVOTHROID) 100 MCG tablet Take 100 mcg by mouth daily before breakfast.    . tazarotene (TAZORAC) 0.05 % cream Apply to affected area bid (Patient not taking: Reported on 07/07/2014) 60 g 5   No facility-administered medications prior to visit.  Takes FeSo4 325: 1 tab qd (more than this causes excessive constipation)  No Known Allergies  ROS Review of Systems  Constitutional: Negative for fever, chills, appetite change and fatigue.  HENT: Negative for congestion, dental problem, ear pain and sore throat.   Eyes: Negative for discharge, redness and visual disturbance.  Respiratory: Negative for cough, chest tightness, shortness of breath and wheezing.   Cardiovascular: Negative for chest pain, palpitations and leg swelling.  Gastrointestinal: Negative for nausea, vomiting, abdominal pain, diarrhea and blood in stool.  Genitourinary: Negative for dysuria, urgency, frequency, hematuria, flank pain and difficulty urinating.  Musculoskeletal: Negative for myalgias, back pain, joint swelling,  arthralgias and neck stiffness.  Skin: Negative for pallor and rash.  Neurological: Negative for dizziness, speech difficulty, weakness and headaches.  Hematological: Negative for adenopathy. Does not bruise/bleed easily.  Psychiatric/Behavioral: Negative for confusion and sleep disturbance. The patient is not nervous/anxious.     PE; Blood pressure 111/74, pulse 65, temperature 98.8 F (37.1 C), temperature source Temporal, resp. rate 18, height 5\' 3"  (1.6 m), weight 169 lb (76.658 kg), SpO2 98 %.  Pt examined with Forestine NaBrandy Miller, CMA, as chaperone. Gen: Alert, well appearing.  Patient is oriented to person, place, time, and situation. AFFECT: pleasant, lucid thought and  speech. ENT: Ears: EACs clear, normal epithelium.  TMs with good light reflex and landmarks bilaterally.  Eyes: no injection, icteris, swelling, or exudate.  EOMI, PERRLA. Nose: no drainage or turbinate edema/swelling.  No injection or focal lesion.  Mouth: lips without lesion/swelling.  Oral mucosa pink and moist.  Dentition intact and without obvious caries or gingival swelling.  Oropharynx without erythema, exudate, or swelling.  Neck: supple/nontender.  No LAD, mass, or TM.  Carotid pulses 2+ bilaterally, without bruits. CV: RRR, no m/r/g.   LUNGS: CTA bilat, nonlabored resps, good aeration in all lung fields. ABD: soft, NT, ND, BS normal.  No hepatospenomegaly or mass.  No bruits. EXT: no clubbing, cyanosis, or edema.  Musculoskeletal: no joint swelling, erythema, warmth, or tenderness.  ROM of all joints intact. Skin - no sores or suspicious lesions or rashes or color changes   Pertinent labs:  Lab Results  Component Value Date   TSH 0.43 07/03/2013   Lab Results  Component Value Date   WBC 5.8 07/03/2013   HGB 11.7* 07/03/2013   HCT 35.7* 07/03/2013   MCV 76.6* 07/03/2013   PLT 246.0 07/03/2013   Lab Results  Component Value Date   CREATININE 0.7 07/03/2013   BUN 10 07/03/2013   NA 139 07/03/2013   K 3.9 07/03/2013   CL 103 07/03/2013   CO2 30 07/03/2013   Lab Results  Component Value Date   ALT 18 07/03/2013   AST 19 07/03/2013   ALKPHOS 52 07/03/2013   BILITOT 0.8 07/03/2013   Lab Results  Component Value Date   CHOL 173 06/13/2013   Lab Results  Component Value Date   HDL 53.10 06/13/2013   Lab Results  Component Value Date   LDLCALC 108* 06/13/2013   Lab Results  Component Value Date   TRIG 61.0 06/13/2013   Lab Results  Component Value Date   CHOLHDL 3 06/13/2013    ASSESSMENT AND PLAN:   Health maintenance exam:  Reviewed age and gender appropriate health maintenance issues (prudent diet, regular exercise, health risks of tobacco and  excessive alcohol, use of seatbelts, fire alarms in home, use of sunscreen).  Also reviewed age and gender appropriate health screening as well as vaccine recommendations. Will do fasting lipids, CMET, and CBC today. Hx of iron def, taking 325mg  FeSO4 daily chronically (menorrhagia): she is going to get hysterectomy this summer. Hx of thyroid nodule: T4 suppression therapy chronically followed by Dr. Talmage NapBalan.  She will f/u with Dr. Talmage NapBalan at least once more, then might decide to have me start following this. Right breast nodule on recent mammo/us: bx set up by GYN and set for tomorrow.  An After Visit Summary was printed and given to the patient.  FOLLOW UP:  Return in about 1 year (around 07/07/2015) for annual CPE (fasting).

## 2014-07-08 ENCOUNTER — Ambulatory Visit
Admission: RE | Admit: 2014-07-08 | Discharge: 2014-07-08 | Disposition: A | Discharge status: Home / Self Care | Payer: BLUE CROSS/BLUE SHIELD | Source: Ambulatory Visit | Attending: Obstetrics and Gynecology | Admitting: Obstetrics and Gynecology

## 2014-07-08 DIAGNOSIS — R928 Other abnormal and inconclusive findings on diagnostic imaging of breast: Secondary | ICD-10-CM

## 2014-07-08 HISTORY — PX: BREAST BIOPSY: SHX20

## 2014-07-09 ENCOUNTER — Encounter: Payer: Self-pay | Admitting: Family Medicine

## 2014-08-19 ENCOUNTER — Other Ambulatory Visit: Payer: Self-pay | Admitting: Obstetrics and Gynecology

## 2014-08-20 DIAGNOSIS — Z90710 Acquired absence of both cervix and uterus: Secondary | ICD-10-CM | POA: Insufficient documentation

## 2015-05-28 ENCOUNTER — Encounter: Payer: Self-pay | Admitting: Family Medicine

## 2015-05-28 ENCOUNTER — Ambulatory Visit (INDEPENDENT_AMBULATORY_CARE_PROVIDER_SITE_OTHER): Payer: BLUE CROSS/BLUE SHIELD | Admitting: Family Medicine

## 2015-05-28 VITALS — BP 107/77 | HR 67 | Temp 97.9°F | Resp 16 | Ht 63.0 in | Wt 161.5 lb

## 2015-05-28 DIAGNOSIS — M67472 Ganglion, left ankle and foot: Secondary | ICD-10-CM

## 2015-05-28 MED ORDER — HYDROCODONE-ACETAMINOPHEN 5-325 MG PO TABS: 1.0000 | ORAL_TABLET | Freq: Four times a day (QID) | ORAL | Status: DC | PRN

## 2015-05-28 NOTE — Progress Notes (Signed)
Pre visit review using our clinic review tool, if applicable. No additional management support is needed unless otherwise documented below in the visit note. 

## 2015-05-28 NOTE — Progress Notes (Signed)
OFFICE VISIT  05/28/2015   CC:  Chief Complaint  Patient presents with  . Foot Pain    left foot x 2 months, off and on   HPI:    Patient is a 35 y.o. Caucasian female who presents for left foot pain. Onset about 2 mo ago when she started running again.  Rest would help.  The area that hurts is dorsum of foot in middle, now starting to see a focal "lump" in the area for the last week or so, pain worse and more constant last 3-4 days even w/ rest.  Pain is impairing sleep.  Hurts worse when foot is dorsiflexed.  Nothing has been taken for pain.   Past Medical History  Diagnosis Date  . Thyroid nodule     Has been euthyroid  . Iron deficiency anemia     Hx of; has heavy menses  (low iron/ferritin 06/2013)  . Menorrhagia     Dr. Henderson Cloudomblin    Past Surgical History  Procedure Laterality Date  . Bunionectomy Left   . Cesarean section    . Lasik  2008 approx  . Breast biopsy Right 07/08/14    Fibroadenoma    Outpatient Prescriptions Prior to Visit  Medication Sig Dispense Refill  . levothyroxine (SYNTHROID, LEVOTHROID) 100 MCG tablet Take 100 mcg by mouth daily before breakfast.    . ferrous sulfate 325 (65 FE) MG tablet Take 325 mg by mouth daily. Reported on 05/28/2015     No facility-administered medications prior to visit.    No Known Allergies  ROS As per HPI  PE: Blood pressure 107/77, pulse 67, temperature 97.9 F (36.6 C), temperature source Oral, resp. rate 16, height 5\' 3"  (1.6 m), weight 161 lb 8 oz (73.256 kg), last menstrual period 06/17/2013, SpO2 96 %. Gen: Alert, well appearing.  Patient is oriented to person, place, time, and situation. Left foot: no erythema or warmth. On the proximal aspect of dorsum of L foot she has a focal --approx 2 cm-- oval shaped, firm-rubbery soft tissue mass in subQ tissues.  This is mildly tender to touch.  No attachment to underlying bone. ROM of foot and toes intact.  LABS:  none  IMPRESSION AND PLAN:  Left foot ganglion  cyst on dorsal aspect, symptomatic. Refer to ortho. Ibuprofen 600mg  with food during daytime, vicodin 5/325 1-2 q6h prn for more severe pain and for hs, #30.  An After Visit Summary was printed and given to the patient.  FOLLOW UP: Return if symptoms worsen or fail to improve.

## 2016-03-20 DIAGNOSIS — M778 Other enthesopathies, not elsewhere classified: Secondary | ICD-10-CM

## 2016-03-20 HISTORY — DX: Other enthesopathies, not elsewhere classified: M77.8

## 2016-06-12 LAB — HM PAP SMEAR: HM PAP: NORMAL

## 2016-07-12 ENCOUNTER — Encounter: Payer: Self-pay | Admitting: Family Medicine

## 2016-07-12 ENCOUNTER — Ambulatory Visit (INDEPENDENT_AMBULATORY_CARE_PROVIDER_SITE_OTHER): Payer: BLUE CROSS/BLUE SHIELD | Admitting: Family Medicine

## 2016-07-12 VITALS — BP 100/66 | HR 64 | Temp 98.2°F | Resp 16 | Ht 62.25 in | Wt 165.5 lb

## 2016-07-12 DIAGNOSIS — Z23 Encounter for immunization: Secondary | ICD-10-CM

## 2016-07-12 DIAGNOSIS — Z0000 Encounter for general adult medical examination without abnormal findings: Secondary | ICD-10-CM | POA: Diagnosis not present

## 2016-07-12 LAB — CBC WITH DIFFERENTIAL/PLATELET
BASOS ABS: 0.1 10*3/uL (ref 0.0–0.1)
Basophils Relative: 1.3 % (ref 0.0–3.0)
EOS ABS: 0.2 10*3/uL (ref 0.0–0.7)
Eosinophils Relative: 2.9 % (ref 0.0–5.0)
HEMATOCRIT: 42.5 % (ref 36.0–46.0)
Hemoglobin: 14.3 g/dL (ref 12.0–15.0)
LYMPHS PCT: 31.5 % (ref 12.0–46.0)
Lymphs Abs: 2 10*3/uL (ref 0.7–4.0)
MCHC: 33.6 g/dL (ref 30.0–36.0)
MCV: 87.2 fl (ref 78.0–100.0)
MONO ABS: 0.4 10*3/uL (ref 0.1–1.0)
Monocytes Relative: 6.3 % (ref 3.0–12.0)
NEUTROS ABS: 3.6 10*3/uL (ref 1.4–7.7)
Neutrophils Relative %: 58 % (ref 43.0–77.0)
PLATELETS: 219 10*3/uL (ref 150.0–400.0)
RBC: 4.87 Mil/uL (ref 3.87–5.11)
RDW: 12.8 % (ref 11.5–15.5)
WBC: 6.2 10*3/uL (ref 4.0–10.5)

## 2016-07-12 LAB — COMPREHENSIVE METABOLIC PANEL
ALBUMIN: 4.3 g/dL (ref 3.5–5.2)
ALT: 14 U/L (ref 0–35)
AST: 15 U/L (ref 0–37)
Alkaline Phosphatase: 49 U/L (ref 39–117)
BILIRUBIN TOTAL: 0.9 mg/dL (ref 0.2–1.2)
BUN: 11 mg/dL (ref 6–23)
CALCIUM: 9.5 mg/dL (ref 8.4–10.5)
CHLORIDE: 101 meq/L (ref 96–112)
CO2: 30 mEq/L (ref 19–32)
CREATININE: 0.73 mg/dL (ref 0.40–1.20)
GFR: 95.26 mL/min (ref >60.00)
Glucose, Bld: 78 mg/dL (ref 70–99)
Potassium: 4.3 mEq/L (ref 3.5–5.1)
SODIUM: 137 meq/L (ref 135–145)
Total Protein: 6.9 g/dL (ref 6.0–8.3)

## 2016-07-12 LAB — LIPID PANEL
CHOLESTEROL: 198 mg/dL (ref 0–200)
HDL: 56.9 mg/dL (ref >39.00)
LDL CALC: 123 mg/dL — AB (ref 0–99)
NonHDL: 141.18
Total CHOL/HDL Ratio: 3
Triglycerides: 91 mg/dL (ref 0.0–149.0)
VLDL: 18.2 mg/dL (ref 0.0–40.0)

## 2016-07-12 LAB — TSH: TSH: 1.36 u[IU]/mL (ref 0.35–4.50)

## 2016-07-12 MED ORDER — TRETINOIN 0.05 % EX CREA: TOPICAL_CREAM | Freq: Every day | CUTANEOUS | 3 refills | Status: DC

## 2016-07-12 NOTE — Patient Instructions (Signed)
Health Maintenance, Female Adopting a healthy lifestyle and getting preventive care can go a long way to promote health and wellness. Talk with your health care provider about what schedule of regular examinations is right for you. This is a good chance for you to check in with your provider about disease prevention and staying healthy. In between checkups, there are plenty of things you can do on your own. Experts have done a lot of research about which lifestyle changes and preventive measures are most likely to keep you healthy. Ask your health care provider for more information. Weight and diet Eat a healthy diet  Be sure to include plenty of vegetables, fruits, low-fat dairy products, and lean protein.  Do not eat a lot of foods high in solid fats, added sugars, or salt.  Get regular exercise. This is one of the most important things you can do for your health.  Most adults should exercise for at least 150 minutes each week. The exercise should increase your heart rate and make you sweat (moderate-intensity exercise).  Most adults should also do strengthening exercises at least twice a week. This is in addition to the moderate-intensity exercise. Maintain a healthy weight  Body mass index (BMI) is a measurement that can be used to identify possible weight problems. It estimates body fat based on height and weight. Your health care provider can help determine your BMI and help you achieve or maintain a healthy weight.  For females 76 years of age and older:  A BMI below 18.5 is considered underweight.  A BMI of 18.5 to 24.9 is normal.  A BMI of 25 to 29.9 is considered overweight.  A BMI of 30 and above is considered obese. Watch levels of cholesterol and blood lipids  You should start having your blood tested for lipids and cholesterol at 37 years of age, then have this test every 5 years.  You may need to have your cholesterol levels checked more often if:  Your lipid or  cholesterol levels are high.  You are older than 37 years of age.  You are at high risk for heart disease. Cancer screening Lung Cancer  Lung cancer screening is recommended for adults 64-42 years old who are at high risk for lung cancer because of a history of smoking.  A yearly low-dose CT scan of the lungs is recommended for people who:  Currently smoke.  Have quit within the past 15 years.  Have at least a 30-pack-year history of smoking. A pack year is smoking an average of one pack of cigarettes a day for 1 year.  Yearly screening should continue until it has been 15 years since you quit.  Yearly screening should stop if you develop a health problem that would prevent you from having lung cancer treatment. Breast Cancer  Practice breast self-awareness. This means understanding how your breasts normally appear and feel.  It also means doing regular breast self-exams. Let your health care provider know about any changes, no matter how small.  If you are in your 20s or 30s, you should have a clinical breast exam (CBE) by a health care provider every 1-3 years as part of a regular health exam.  If you are 34 or older, have a CBE every year. Also consider having a breast X-ray (mammogram) every year.  If you have a family history of breast cancer, talk to your health care provider about genetic screening.  If you are at high risk for breast cancer, talk  to your health care provider about having an MRI and a mammogram every year.  Breast cancer gene (BRCA) assessment is recommended for women who have family members with BRCA-related cancers. BRCA-related cancers include:  Breast.  Ovarian.  Tubal.  Peritoneal cancers.  Results of the assessment will determine the need for genetic counseling and BRCA1 and BRCA2 testing. Cervical Cancer  Your health care provider may recommend that you be screened regularly for cancer of the pelvic organs (ovaries, uterus, and vagina).  This screening involves a pelvic examination, including checking for microscopic changes to the surface of your cervix (Pap test). You may be encouraged to have this screening done every 3 years, beginning at age 24.  For women ages 66-65, health care providers may recommend pelvic exams and Pap testing every 3 years, or they may recommend the Pap and pelvic exam, combined with testing for human papilloma virus (HPV), every 5 years. Some types of HPV increase your risk of cervical cancer. Testing for HPV may also be done on women of any age with unclear Pap test results.  Other health care providers may not recommend any screening for nonpregnant women who are considered low risk for pelvic cancer and who do not have symptoms. Ask your health care provider if a screening pelvic exam is right for you.  If you have had past treatment for cervical cancer or a condition that could lead to cancer, you need Pap tests and screening for cancer for at least 20 years after your treatment. If Pap tests have been discontinued, your risk factors (such as having a new sexual partner) need to be reassessed to determine if screening should resume. Some women have medical problems that increase the chance of getting cervical cancer. In these cases, your health care provider may recommend more frequent screening and Pap tests. Colorectal Cancer  This type of cancer can be detected and often prevented.  Routine colorectal cancer screening usually begins at 37 years of age and continues through 37 years of age.  Your health care provider may recommend screening at an earlier age if you have risk factors for colon cancer.  Your health care provider may also recommend using home test kits to check for hidden blood in the stool.  A small camera at the end of a tube can be used to examine your colon directly (sigmoidoscopy or colonoscopy). This is done to check for the earliest forms of colorectal cancer.  Routine  screening usually begins at age 41.  Direct examination of the colon should be repeated every 5-10 years through 37 years of age. However, you may need to be screened more often if early forms of precancerous polyps or small growths are found. Skin Cancer  Check your skin from head to toe regularly.  Tell your health care provider about any new moles or changes in moles, especially if there is a change in a mole's shape or color.  Also tell your health care provider if you have a mole that is larger than the size of a pencil eraser.  Always use sunscreen. Apply sunscreen liberally and repeatedly throughout the day.  Protect yourself by wearing long sleeves, pants, a wide-brimmed hat, and sunglasses whenever you are outside. Heart disease, diabetes, and high blood pressure  High blood pressure causes heart disease and increases the risk of stroke. High blood pressure is more likely to develop in:  People who have blood pressure in the high end of the normal range (130-139/85-89 mm Hg).  People who are overweight or obese.  People who are African American.  If you are 59-24 years of age, have your blood pressure checked every 3-5 years. If you are 34 years of age or older, have your blood pressure checked every year. You should have your blood pressure measured twice-once when you are at a hospital or clinic, and once when you are not at a hospital or clinic. Record the average of the two measurements. To check your blood pressure when you are not at a hospital or clinic, you can use:  An automated blood pressure machine at a pharmacy.  A home blood pressure monitor.  If you are between 29 years and 60 years old, ask your health care provider if you should take aspirin to prevent strokes.  Have regular diabetes screenings. This involves taking a blood sample to check your fasting blood sugar level.  If you are at a normal weight and have a low risk for diabetes, have this test once  every three years after 37 years of age.  If you are overweight and have a high risk for diabetes, consider being tested at a younger age or more often. Preventing infection Hepatitis B  If you have a higher risk for hepatitis B, you should be screened for this virus. You are considered at high risk for hepatitis B if:  You were born in a country where hepatitis B is common. Ask your health care provider which countries are considered high risk.  Your parents were born in a high-risk country, and you have not been immunized against hepatitis B (hepatitis B vaccine).  You have HIV or AIDS.  You use needles to inject street drugs.  You live with someone who has hepatitis B.  You have had sex with someone who has hepatitis B.  You get hemodialysis treatment.  You take certain medicines for conditions, including cancer, organ transplantation, and autoimmune conditions. Hepatitis C  Blood testing is recommended for:  Everyone born from [birth year] through 1966.  Anyone with known risk factors for hepatitis C. Sexually transmitted infections (STIs)  You should be screened for sexually transmitted infections (STIs) including gonorrhea and chlamydia if:  You are sexually active and are younger than 37 years of age.  You are older than 37 years of age and your health care provider tells you that you are at risk for this type of infection.  Your sexual activity has changed since you were last screened and you are at an increased risk for chlamydia or gonorrhea. Ask your health care provider if you are at risk.  If you do not have HIV, but are at risk, it may be recommended that you take a prescription medicine daily to prevent HIV infection. This is called pre-exposure prophylaxis (PrEP). You are considered at risk if:  You are sexually active and do not regularly use condoms or know the HIV status of your partner(s).  You take drugs by injection.  You are sexually active with a partner  who has HIV. Talk with your health care provider about whether you are at high risk of being infected with HIV. If you choose to begin PrEP, you should first be tested for HIV. You should then be tested every 3 months for as long as you are taking PrEP. Pregnancy  If you are premenopausal and you may become pregnant, ask your health care provider about preconception counseling.  If you may become pregnant, take 400 to 800 micrograms (mcg) of folic acid  every day.  If you want to prevent pregnancy, talk to your health care provider about birth control (contraception). Osteoporosis and menopause  Osteoporosis is a disease in which the bones lose minerals and strength with aging. This can result in serious bone fractures. Your risk for osteoporosis can be identified using a bone density scan.  If you are 4 years of age or older, or if you are at risk for osteoporosis and fractures, ask your health care provider if you should be screened.  Ask your health care provider whether you should take a calcium or vitamin D supplement to lower your risk for osteoporosis.  Menopause may have certain physical symptoms and risks.  Hormone replacement therapy may reduce some of these symptoms and risks. Talk to your health care provider about whether hormone replacement therapy is right for you. Follow these instructions at home:  Schedule regular health, dental, and eye exams.  Stay current with your immunizations.  Do not use any tobacco products including cigarettes, chewing tobacco, or electronic cigarettes.  If you are pregnant, do not drink alcohol.  If you are breastfeeding, limit how much and how often you drink alcohol.  Limit alcohol intake to no more than 1 drink per day for nonpregnant women. One drink equals 12 ounces of beer, 5 ounces of wine, or 1 ounces of hard liquor.  Do not use street drugs.  Do not share needles.  Ask your health care provider for help if you need support  or information about quitting drugs.  Tell your health care provider if you often feel depressed.  Tell your health care provider if you have ever been abused or do not feel safe at home. This information is not intended to replace advice given to you by your health care provider. Make sure you discuss any questions you have with your health care provider. Document Released: 09/19/2010 Document Revised: 08/12/2015 Document Reviewed: 12/08/2014 Elsevier Interactive Patient Education  2017 [employer].

## 2016-07-12 NOTE — Progress Notes (Signed)
Pre visit review using our clinic review tool, if applicable. No additional management support is needed unless otherwise documented below in the visit note. 

## 2016-07-12 NOTE — Progress Notes (Signed)
Office Note 07/12/2016  CC:  Chief Complaint  Patient presents with  . Annual Exam    Pt is fasting.    HPI:  Sydney Ramsey is a 36 y.o. White female who is here for annual health maintenance exam. Pt has GYN she sees at appropriate intervals for pap/pelvic.  Eye exam: annual. Dental: preventatives UTD.  Exercise: walks her dogs. Diet: trying to eat more veggies and stay away from too many sweets.   Past Medical History:  Diagnosis Date  . Iron deficiency anemia    Hx of; has heavy menses  (low iron/ferritin 06/2013)  . Menorrhagia    [name]  . Thyroid nodule    Has been euthyroid, but Dr. Talmage Nap has had her on levothyroxine suppression.    Past Surgical History:  Procedure Laterality Date  . BREAST BIOPSY Right 07/08/14   Fibroadenoma  . BUNIONECTOMY Left   . CESAREAN SECTION    . LASIK  2008 approx  . VAGINAL HYSTERECTOMY     with bilat salpingectomy: ovaries still in.  (done for DUB).    Family History  Problem Relation Age of Onset  . Alzheimer's disease Paternal Grandmother   . Heart attack Paternal Grandfather     Social History   Social History  . Marital status: Married    Spouse name: N/A  . Number of children: N/A  . Years of education: N/A   Occupational History  . Not on file.   Social History Main Topics  . Smoking status: Former Smoker    Quit date: 06/14/2010  . Smokeless tobacco: Never Used  . Alcohol use 1.2 oz/week    2 Cans of beer per week     Comment: twice weekly  . Drug use: No  . Sexual activity: Not on file   Other Topics Concern  . Not on file   Social History Narrative   Married, has 1 son and 1 daughter.   Occupation: stay at home mom.   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   Lives in [place].   Tob: 5 pack-yr hx, quit 2012.   Alc: occasional beer.   No drugs.   Exercise: 3-4 days a week : running and/or aerobics.   MEDS: not taking vicodin listed below. Outpatient Medications  Prior to Visit  Medication Sig Dispense Refill  . Calcium-Vitamin D-Vitamin K (CALCIUM SOFT CHEWS PO) Take 1 each by mouth daily.    Marland Kitchen levothyroxine (SYNTHROID, LEVOTHROID) 100 MCG tablet Take 100 mcg by mouth daily before breakfast.    . HYDROcodone-acetaminophen (NORCO/VICODIN) 5-325 MG tablet Take 1-2 tablets by mouth every 6 (six) hours as needed for moderate pain. (Patient not taking: Reported on 07/12/2016) 30 tablet 0   No facility-administered medications prior to visit.     No Known Allergies  ROS Review of Systems  Constitutional: Negative for appetite change, chills, fatigue and fever.  HENT: Negative for congestion, dental problem, ear pain and sore throat.   Eyes: Negative for discharge, redness and visual disturbance.  Respiratory: Negative for cough, chest tightness, shortness of breath and wheezing.   Cardiovascular: Negative for chest pain, palpitations and leg swelling.  Gastrointestinal: Negative for abdominal pain, blood in stool, diarrhea, nausea and vomiting.  Genitourinary: Negative for difficulty urinating, dysuria, flank pain, frequency, hematuria and urgency.  Musculoskeletal: Negative for arthralgias, back pain, joint swelling, myalgias and neck stiffness.  Skin: Negative for pallor and rash.  Neurological: Negative for dizziness, speech difficulty, weakness and headaches.  Hematological: Negative for adenopathy. Does not bruise/bleed easily.  Psychiatric/Behavioral: Negative for confusion and sleep disturbance. The patient is not nervous/anxious.     PE; Blood pressure 100/66, pulse 64, temperature 98.2 F (36.8 C), temperature source Oral, resp. rate 16, height 5' 2.25" (1.581 m), weight 165 lb 8 oz (75.1 kg), last menstrual period 06/17/2013, SpO2 97 %. Body mass index is 30.03 kg/m. Pt examined with Wallace Keller, CMA, as chaperone. Gen: Alert, well appearing.  Patient is oriented to person, place, time, and situation. AFFECT: pleasant, lucid thought and  speech. ENT: Ears: EACs clear, normal epithelium.  TMs with good light reflex and landmarks bilaterally.  Eyes: no injection, icteris, swelling, or exudate.  EOMI, PERRLA. Nose: no drainage or turbinate edema/swelling.  No injection or focal lesion.  Mouth: lips without lesion/swelling.  Oral mucosa pink and moist.  Dentition intact and without obvious caries or gingival swelling.  Oropharynx without erythema, exudate, or swelling.  Neck: supple/nontender.  No LAD, mass, or TM.  Carotid pulses 2+ bilaterally, without bruits. CV: RRR, no m/r/g.   LUNGS: CTA bilat, nonlabored resps, good aeration in all lung fields. ABD: soft, NT, ND, BS normal.  No hepatospenomegaly or mass.  No bruits. EXT: no clubbing, cyanosis, or edema.  Musculoskeletal: no joint swelling, erythema, warmth, or tenderness.  ROM of all joints intact. Skin - no sores or suspicious lesions or rashes or color changes.  Face with a few very small comedonal lesions.  Pertinent labs:  Lab Results  Component Value Date   TSH 0.43 07/03/2013   Lab Results  Component Value Date   WBC 5.0 07/07/2014   HGB 13.3 07/07/2014   HCT 39.4 07/07/2014   MCV 83.8 07/07/2014   PLT 201.0 07/07/2014   Lab Results  Component Value Date   CREATININE 0.71 07/07/2014   BUN 13 07/07/2014   NA 137 07/07/2014   K 4.1 07/07/2014   CL 105 07/07/2014   CO2 28 07/07/2014   Lab Results  Component Value Date   ALT 15 07/07/2014   AST 16 07/07/2014   ALKPHOS 49 07/07/2014   BILITOT 0.7 07/07/2014   Lab Results  Component Value Date   CHOL 150 07/07/2014   Lab Results  Component Value Date   HDL 41.20 07/07/2014   Lab Results  Component Value Date   LDLCALC 93 07/07/2014   Lab Results  Component Value Date   TRIG 78.0 07/07/2014   Lab Results  Component Value Date   CHOLHDL 4 07/07/2014   ASSESSMENT AND PLAN:   Health maintenance exam: Reviewed age and gender appropriate health maintenance issues (prudent diet, regular  exercise, health risks of tobacco and excessive alcohol, use of seatbelts, fire alarms in home, use of sunscreen).  Also reviewed age and gender appropriate health screening as well as vaccine recommendations. Tdap today. Fasting HP labs today. For pt's mild acne, she requested rx for Retin A 0.05% cream and I sent this in today.  An After Visit Summary was printed and given to the patient.  FOLLOW UP:  Return in about 1 year (around 07/12/2017) for annual CPE (fasting).  Signed:  Santiago Bumpers, MD           07/12/2016

## 2016-07-14 ENCOUNTER — Ambulatory Visit (INDEPENDENT_AMBULATORY_CARE_PROVIDER_SITE_OTHER): Payer: BLUE CROSS/BLUE SHIELD | Admitting: Podiatry

## 2016-07-14 ENCOUNTER — Ambulatory Visit (INDEPENDENT_AMBULATORY_CARE_PROVIDER_SITE_OTHER): Payer: BLUE CROSS/BLUE SHIELD

## 2016-07-14 DIAGNOSIS — M779 Enthesopathy, unspecified: Secondary | ICD-10-CM

## 2016-07-14 MED ORDER — TRIAMCINOLONE ACETONIDE 10 MG/ML IJ SUSP
10.0000 mg | Freq: Once | INTRAMUSCULAR | Status: AC
  Administered 2016-07-14: 10 mg

## 2016-07-17 NOTE — Progress Notes (Signed)
Subjective:    Patient ID: Sydney Ramsey, female   DOB: 36 y.o.   MRN: [id number]   HPI patient presents with a lot of pain in her left foot over the last month and does not remember specific injury but it's difficult to walk on and has had previous bunion done a number of years ago    Review of Systems  All other systems reviewed and are negative.       Objective:  Physical Exam  Cardiovascular: Intact distal pulses.   Musculoskeletal: Normal range of motion.  Neurological: She is alert.  Skin: Skin is warm.  Nursing note and vitals reviewed.  neurovascular status intact muscle strength was adequate range of motion was found to be within normal limits with patient found to have quite a bit of discomfort in the second MPJ left with fluid buildup around the joint surface and swelling noted. She has well corrected bunion deformity left with good alignment noted and excellent range of motion first MPJ and has good digital perfusion and is well oriented 3     Assessment:    Inflammatory capsulitis second MPJ left with doing well after having had bunion surgery     Plan:    H&P condition reviewed and we'll focus on the inflamed joint first. I went ahead today and I did a proximal nerve block and then aspirated the joint getting out a small amount of clear fluid and injected with quarter cc of dexamethasone Kenalog and applied padding to reduce pressure against the joint and advised on rigid bottom shoes. Reappoint to recheck again in the next 4 weeks or earlier if needed  X-rays indicate that the joint looks healthy and I did not note signs of stress fracture arthritis and is well corrected first MPJ left with pin in place

## 2016-08-03 ENCOUNTER — Encounter: Payer: Self-pay | Admitting: Podiatry

## 2016-08-03 ENCOUNTER — Ambulatory Visit (INDEPENDENT_AMBULATORY_CARE_PROVIDER_SITE_OTHER): Payer: BLUE CROSS/BLUE SHIELD | Admitting: Podiatry

## 2016-08-03 DIAGNOSIS — M779 Enthesopathy, unspecified: Secondary | ICD-10-CM

## 2016-08-07 ENCOUNTER — Encounter: Payer: Self-pay | Admitting: Family Medicine

## 2016-08-07 NOTE — Progress Notes (Signed)
Subjective:    Patient ID: Sydney KronerAmanda M Fiorito, female   DOB: 36 y.o.   MRN: [id number]   HPI patient states that she is still having trouble with her left foot and while it's improving it is still painful when palpated    ROS      Objective:  Physical Exam Neurovascular status intact with inflammation left forefoot that's improved but present with pain    Assessment:    Inflammatory capsulitis present left with moderate improvement but still pain noted     Plan:   Discussed continued padding and shoe gear modifications and discussed long-term utilization of a functional type orthotic and patient will be seen back to recheck

## 2016-09-21 ENCOUNTER — Ambulatory Visit (INDEPENDENT_AMBULATORY_CARE_PROVIDER_SITE_OTHER): Payer: BLUE CROSS/BLUE SHIELD | Admitting: Podiatry

## 2016-09-21 ENCOUNTER — Encounter: Payer: Self-pay | Admitting: Podiatry

## 2016-09-21 DIAGNOSIS — M779 Enthesopathy, unspecified: Secondary | ICD-10-CM | POA: Diagnosis not present

## 2016-09-21 NOTE — Progress Notes (Signed)
Subjective:    Patient ID: Sydney KronerAmanda M Ramsey, female   DOB: 36 y.o.   MRN: [id number]   HPI patient states she is improved but if she's very active but still hurts    ROS      Objective:  Physical Exam neurovascular status intact with diminished discomfort around the second MPJ left with fluid buildup still noted and pain with deep palpation     Assessment:    Capsulitis improved but present left     Plan:    Recommended at this time utilization of orthotics to support the foot and to reduce pressure against the joint surface. Educated her again on the importance of shoe gear usage

## 2016-10-12 ENCOUNTER — Ambulatory Visit (INDEPENDENT_AMBULATORY_CARE_PROVIDER_SITE_OTHER): Payer: Self-pay | Admitting: Podiatry

## 2016-10-12 ENCOUNTER — Encounter: Payer: Self-pay | Admitting: Podiatry

## 2016-10-12 DIAGNOSIS — M779 Enthesopathy, unspecified: Secondary | ICD-10-CM

## 2016-10-12 NOTE — Patient Instructions (Signed)

## 2016-10-13 NOTE — Progress Notes (Signed)
Subjective:    Patient ID: Sydney Ramsey, female   DOB: 36 y.o.   MRN: [id number]   HPI patient presents for orthotic dispense    ROS      Objective:  Physical Exam no changes noted with discomfort under control     Assessment:     Inflammatory capsulitis    Plan:    Orthotics dispensed

## 2016-10-16 ENCOUNTER — Encounter: Payer: Self-pay | Admitting: Family Medicine

## 2017-07-13 ENCOUNTER — Encounter: Payer: BLUE CROSS/BLUE SHIELD | Admitting: Family Medicine

## 2017-07-16 ENCOUNTER — Encounter: Payer: Self-pay | Admitting: Family Medicine

## 2017-07-16 ENCOUNTER — Ambulatory Visit (INDEPENDENT_AMBULATORY_CARE_PROVIDER_SITE_OTHER): Payer: PRIVATE HEALTH INSURANCE | Admitting: Family Medicine

## 2017-07-16 ENCOUNTER — Other Ambulatory Visit: Payer: Self-pay

## 2017-07-16 VITALS — BP 106/75 | HR 59 | Temp 98.6°F | Resp 16 | Ht 63.0 in | Wt 163.0 lb

## 2017-07-16 DIAGNOSIS — Z0000 Encounter for general adult medical examination without abnormal findings: Secondary | ICD-10-CM | POA: Diagnosis not present

## 2017-07-16 LAB — CBC WITH DIFFERENTIAL/PLATELET
BASOS ABS: 0.1 10*3/uL (ref 0.0–0.1)
BASOS PCT: 1 % (ref 0.0–3.0)
Eosinophils Absolute: 0.1 10*3/uL (ref 0.0–0.7)
Eosinophils Relative: 1.4 % (ref 0.0–5.0)
HEMATOCRIT: 40.9 % (ref 36.0–46.0)
Hemoglobin: 13.9 g/dL (ref 12.0–15.0)
LYMPHS PCT: 38.3 % (ref 12.0–46.0)
Lymphs Abs: 2.2 10*3/uL (ref 0.7–4.0)
MCHC: 33.9 g/dL (ref 30.0–36.0)
MCV: 88.5 fl (ref 78.0–100.0)
MONO ABS: 0.3 10*3/uL (ref 0.1–1.0)
Monocytes Relative: 5.3 % (ref 3.0–12.0)
Neutro Abs: 3.1 10*3/uL (ref 1.4–7.7)
Neutrophils Relative %: 54 % (ref 43.0–77.0)
Platelets: 198 10*3/uL (ref 150.0–400.0)
RBC: 4.62 Mil/uL (ref 3.87–5.11)
RDW: 12.5 % (ref 11.5–15.5)
WBC: 5.8 10*3/uL (ref 4.0–10.5)

## 2017-07-16 LAB — COMPREHENSIVE METABOLIC PANEL
ALBUMIN: 4.2 g/dL (ref 3.5–5.2)
ALK PHOS: 47 U/L (ref 39–117)
ALT: 12 U/L (ref 0–35)
AST: 18 U/L (ref 0–37)
BUN: 11 mg/dL (ref 6–23)
CALCIUM: 9.3 mg/dL (ref 8.4–10.5)
CO2: 30 mEq/L (ref 19–32)
Chloride: 102 mEq/L (ref 96–112)
Creatinine, Ser: 0.73 mg/dL (ref 0.40–1.20)
GFR: 94.74 mL/min (ref >60.00)
Glucose, Bld: 77 mg/dL (ref 70–99)
Potassium: 4.1 mEq/L (ref 3.5–5.1)
SODIUM: 139 meq/L (ref 135–145)
Total Bilirubin: 1 mg/dL (ref 0.2–1.2)
Total Protein: 6.8 g/dL (ref 6.0–8.3)

## 2017-07-16 LAB — LIPID PANEL
Cholesterol: 170 mg/dL (ref 0–200)
HDL: 52.3 mg/dL (ref >39.00)
LDL CALC: 104 mg/dL — AB (ref 0–99)
NonHDL: 117.56
TRIGLYCERIDES: 66 mg/dL (ref 0.0–149.0)
Total CHOL/HDL Ratio: 3
VLDL: 13.2 mg/dL (ref 0.0–40.0)

## 2017-07-16 LAB — TSH: TSH: 1.96 u[IU]/mL (ref 0.35–4.50)

## 2017-07-16 NOTE — Progress Notes (Signed)
Office Note 07/16/2017  CC:  Chief Complaint  Patient presents with  . Annual Exam    HPI:  Sydney Ramsey is a 37 y.o. White female who is here for annual health maintenance exam.  Exercise: cardio on treadmill, rower, wts, wts--for the last year 4 days per week. Diet: healthy other than trying to work on eating less simple carbs.  Past Medical History:  Diagnosis Date  . Capsulitis of foot 2018   L forefoot (Dr. Charlsie Merles).  Orthotics.  . Iron deficiency anemia    Hx of; has heavy menses  (low iron/ferritin 06/2013)  . Menorrhagia    [name]  . Thyroid nodule    Has been euthyroid, but Dr. Talmage Nap has had her on levothyroxine suppression.    Past Surgical History:  Procedure Laterality Date  . BREAST BIOPSY Right 07/08/14   Fibroadenoma  . BUNIONECTOMY Left   . CESAREAN SECTION    . LASIK  2008 approx  . VAGINAL HYSTERECTOMY     with bilat salpingectomy: ovaries still in.  (done for DUB).    Family History  Problem Relation Age of Onset  . Alzheimer's disease Paternal Grandmother   . Heart attack Paternal Grandfather     Social History   Socioeconomic History  . Marital status: Married    Spouse name: Not on file  . Number of children: Not on file  . Years of education: Not on file  . Highest education level: Not on file  Occupational History  . Not on file  Social Needs  . Financial resource strain: Not on file  . Food insecurity:    Worry: Not on file    Inability: Not on file  . Transportation needs:    Medical: Not on file    Non-medical: Not on file  Tobacco Use  . Smoking status: Former Smoker    Last attempt to quit: 06/14/2010    Years since quitting: 7.0  . Smokeless tobacco: Never Used  Substance and Sexual Activity  . Alcohol use: Yes    Alcohol/week: 1.2 oz    Types: 2 Cans of beer per week    Comment: twice weekly  . Drug use: No  . Sexual activity: Not on file  Lifestyle  . Physical activity:    Days per week: Not on file    Minutes per session: Not on file  . Stress: Not on file  Relationships  . Social connections:    Talks on phone: Not on file    Gets together: Not on file    Attends religious service: Not on file    Active member of club or organization: Not on file    Attends meetings of clubs or organizations: Not on file    Relationship status: Not on file  . Intimate partner violence:    Fear of current or ex partner: Not on file    Emotionally abused: Not on file    Physically abused: Not on file    Forced sexual activity: Not on file  Other Topics Concern  . Not on file  Social History Narrative   Married, has 1 son and 1 daughter.   Occupation: stay at home mom.   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   Lives in [place].   Tob: 5 pack-yr hx, quit 2012.   Alc: occasional beer.   No drugs.   Exercise: 3-4 days a week : running and/or aerobics.    Outpatient Medications Prior  to Visit  Medication Sig Dispense Refill  . Calcium-Vitamin D-Vitamin K (CALCIUM SOFT CHEWS PO) Take 1 each by mouth daily.    Marland Kitchen levothyroxine (SYNTHROID, LEVOTHROID) 50 MCG tablet Take 50 mcg by mouth daily.  12  . tretinoin (RETIN-A) 0.05 % cream Apply topically at bedtime. 45 g 3  . levothyroxine (SYNTHROID, LEVOTHROID) 100 MCG tablet Take 100 mcg by mouth daily before breakfast.     No facility-administered medications prior to visit.     No Known Allergies  ROS Review of Systems  Constitutional: Negative for appetite change, chills, fatigue and fever.  HENT: Negative for congestion, dental problem, ear pain and sore throat.   Eyes: Negative for discharge, redness and visual disturbance.  Respiratory: Negative for cough, chest tightness, shortness of breath and wheezing.   Cardiovascular: Negative for chest pain, palpitations and leg swelling.  Gastrointestinal: Negative for abdominal pain, blood in stool, diarrhea, nausea and vomiting.  Genitourinary: Negative for difficulty  urinating, dysuria, flank pain, frequency, hematuria and urgency.  Musculoskeletal: Negative for arthralgias, back pain, joint swelling, myalgias and neck stiffness.  Skin: Negative for pallor and rash.  Neurological: Negative for dizziness, speech difficulty, weakness and headaches.  Hematological: Negative for adenopathy. Does not bruise/bleed easily.  Psychiatric/Behavioral: Negative for confusion and sleep disturbance. The patient is not nervous/anxious.     PE; Blood pressure 106/75, pulse (!) 59, temperature 98.6 F (37 C), temperature source Temporal, resp. rate 16, height  (1.6 m), weight 163 lb (73.9 kg), last menstrual period 06/17/2013, SpO2 97 %. Body mass index is 28.87 kg/m.  Exam chaperoned by Vanetta Mulders, CMA. Gen: Alert, well appearing.  Patient is oriented to person, place, time, and situation. AFFECT: pleasant, lucid thought and speech. ENT: Ears: EACs clear, normal epithelium.  TMs with good light reflex and landmarks bilaterally.  Eyes: no injection, icteris, swelling, or exudate.  EOMI, PERRLA. Nose: no drainage or turbinate edema/swelling.  No injection or focal lesion.  Mouth: lips without lesion/swelling.  Oral mucosa pink and moist.  Dentition intact and without obvious caries or gingival swelling.  Oropharynx without erythema, exudate, or swelling.  Neck: supple/nontender.  No LAD, mass, or TM.  Carotid pulses 2+ bilaterally, without bruits. CV: RRR, no m/r/g.   LUNGS: CTA bilat, nonlabored resps, good aeration in all lung fields. ABD: soft, NT, ND, BS normal.  No hepatospenomegaly or mass.  No bruits. EXT: no clubbing, cyanosis, or edema.  Musculoskeletal: no joint swelling, erythema, warmth, or tenderness.  ROM of all joints intact. Skin - no sores or suspicious lesions or rashes or color changes   Pertinent labs:    Chemistry      Component Value Date/Time   NA 137 07/12/2016 0924   K 4.3 07/12/2016 0924   CL 101 07/12/2016 0924   CO2 30 07/12/2016  0924   BUN 11 07/12/2016 0924   CREATININE 0.73 07/12/2016 0924      Component Value Date/Time   CALCIUM 9.5 07/12/2016 0924   ALKPHOS 49 07/12/2016 0924   AST 15 07/12/2016 0924   ALT 14 07/12/2016 0924   BILITOT 0.9 07/12/2016 0924     Lab Results  Component Value Date   TSH 1.36 07/12/2016   Lab Results  Component Value Date   WBC 6.2 07/12/2016   HGB 14.3 07/12/2016   HCT 42.5 07/12/2016   MCV 87.2 07/12/2016   PLT 219.0 07/12/2016   Lab Results  Component Value Date   CHOL 198 07/12/2016   HDL 56.90  07/12/2016   LDLCALC 123 (H) 07/12/2016   TRIG 91.0 07/12/2016   CHOLHDL 3 07/12/2016     ASSESSMENT AND PLAN:   Health maintenance exam: Reviewed age and gender appropriate health maintenance issues (prudent diet, regular exercise, health risks of tobacco and excessive alcohol, use of seatbelts, fire alarms in home, use of sunscreen).  Also reviewed age and gender appropriate health screening as well as vaccine recommendations. Vaccines: UTD. Labs: fasting HP labs today. Cervical ca screening: per GYN, [name] (04/2017)-normal pap last year, next is due next year (even though she does not have a cervix). Breast ca screening: per GYN, [name] (04/2017) she will start annual mammo at age 60 yrs.  An After Visit Summary was printed and given to the patient.  FOLLOW UP:  Return in about 1 year (around 07/17/2018) for annual CPE (fasting).  Signed:  Santiago Bumpers, MD           07/16/2017

## 2017-07-16 NOTE — Patient Instructions (Signed)

## 2017-07-18 ENCOUNTER — Encounter: Payer: Self-pay | Admitting: Family Medicine

## 2017-11-30 ENCOUNTER — Other Ambulatory Visit: Payer: Self-pay | Admitting: Endocrinology

## 2017-11-30 DIAGNOSIS — E049 Nontoxic goiter, unspecified: Secondary | ICD-10-CM

## 2017-12-05 ENCOUNTER — Ambulatory Visit
Admission: RE | Admit: 2017-12-05 | Discharge: 2017-12-05 | Disposition: A | Discharge status: Home / Self Care | Payer: PRIVATE HEALTH INSURANCE | Source: Ambulatory Visit | Attending: Endocrinology | Admitting: Endocrinology

## 2017-12-05 DIAGNOSIS — E049 Nontoxic goiter, unspecified: Secondary | ICD-10-CM

## 2018-04-12 ENCOUNTER — Ambulatory Visit (INDEPENDENT_AMBULATORY_CARE_PROVIDER_SITE_OTHER): Payer: PRIVATE HEALTH INSURANCE | Admitting: Cardiovascular Disease

## 2018-04-12 ENCOUNTER — Encounter: Payer: Self-pay | Admitting: Cardiovascular Disease

## 2018-04-12 VITALS — BP 114/76 | HR 61 | Ht 63.0 in | Wt 168.0 lb

## 2018-04-12 DIAGNOSIS — R079 Chest pain, unspecified: Secondary | ICD-10-CM | POA: Diagnosis not present

## 2018-04-12 DIAGNOSIS — R000 Tachycardia, unspecified: Secondary | ICD-10-CM

## 2018-04-12 NOTE — Patient Instructions (Signed)
Medication Instructions:  Your physician recommends that you continue on your current medications as directed. Please refer to the Current Medication list given to you today.  If you need a refill on your cardiac medications before your next appointment, please call your pharmacy.   Lab work: None ordered If you have labs (blood work) drawn today and your tests are completely normal, you will receive your results only by: Marland Kitchen. MyChart Message (if you have MyChart) OR . A paper copy in the mail If you have any lab test that is abnormal or we need to change your treatment, we will call you to review the results.  Testing/Procedures: Your physician has recommended that you wear a holter monitor. Holter monitors are medical devices that record the heart's electrical activity. Doctors most often use these monitors to diagnose arrhythmias. Arrhythmias are problems with the speed or rhythm of the heartbeat. The monitor is a small, portable device. You can wear one while you do your normal daily activities. This is usually used to diagnose what is causing palpitations/syncope (passing out).  Your physician has requested that you have a stress echocardiogram. For further information please visit [email or web address]. Please follow instruction sheet as given.    Follow-Up: At [facility], you and your health needs are our priority.  As part of our continuing mission to provide you with exceptional heart care, we have created designated Provider Care Teams.  These Care Teams include your primary Cardiologist (physician) and Advanced Practice Providers (APPs -  Physician Assistants and Nurse Practitioners) who all work together to provide you with the care you need, when you need it.  Your physician recommends that you schedule a follow-up appointment after testing

## 2018-04-12 NOTE — Progress Notes (Signed)
Cardiology Consultation note:    Date:  04/12/2018   ID:  Sydney Ramsey, DOB 1980/11/03, MRN [id number]  PCP:  Jeoffrey Massed, MD  Cardiologist:  No primary care provider on file.  Electrophysiologist:  None   Referring MD: Jeoffrey Massed, MD   Chief Complaint  Patient presents with  . Chest Pain  . Shortness of Breath  Sydney Ramsey is a 38 y.o. female who is being seen today for the evaluation of tachycardia and chest pain at the request of McGowen, Maryjean Morn, MD.   History of Present Illness:    Sydney Ramsey is a 38 y.o. 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  She has always been physically active and fit.  She exercises at [facility].  Typically she goes to the gym 3 to 4 days a week and exercises for an hour at a time with alternating cardio and weight training.    Over the last month she has had episodes of chest pain while exercising at the gym.  When the chest discomfort occurs, she will look at her Apple Watch and notice tachycardia heart rate in the 160s (although she does not notice palpitations).  She has also noticed that her heart rate jumps up much quicker with exercise than it did in the past, also quicker than that of the other participants in the same exercise.  She can generally continue exercising despite the chest discomfort and often it will go away.  The symptoms only generally last for a few minutes each time.  Discomfort was described as tightness.  She does not feel particularly short of breath and denies dizziness/presyncope.  She has not had problems with leg edema, chest pain or dyspnea at rest, neurological complaints or claudication.  Her Apple Watch she uses a light sensor to report the heart rate.  It does not display an electrical tracing.  She cannot tell if the tachycardia occurs gradually or abruptly.  She reports that her  Apple Watch has shown tachycardia every single day.  She does not really have any cardiac risk factors.  As mentioned she is premenopausal, but she has hysterectomy (without oophorectomy).  She does not smoke cigarettes (she smoked briefly but quit in 2013).  He does not have hypertension or diabetes.  Last April her total cholesterol was 170, LDL 104, HDL 52.  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  Both parents and 2 older siblings are alive and well without any cardiac or vascular problems.  She has been evaluated a couple of times for the episodes of chest pain and has been told on one occasion that she probably had bronchospasm, the other that she had costochondritis.  She was actually seen on January 22 in the emergency room.  Labs were normal except for a slightly elevated TSH.  Troponin was undetectable.  Chest x-ray was normal.  ECG showed mild sinus bradycardia but no repolarization abnormalities.  Her resting electrocardiogram is completely normal.  Past Medical History:  Diagnosis Date  . Capsulitis of foot 2018   L forefoot (Dr. Charlsie Merles).  Orthotics.  . Iron deficiency anemia    Hx of; has heavy menses  (low iron/ferritin 06/2013)  . Menorrhagia    [name]  . Thyroid nodule    Has been euthyroid, but Dr. Talmage Nap has had her on levothyroxine  suppression.    Past Surgical History:  Procedure Laterality Date  . BREAST BIOPSY Right 07/08/14   Fibroadenoma  . BUNIONECTOMY Left   . CESAREAN SECTION    . LASIK  2008 approx  . VAGINAL HYSTERECTOMY     with bilat salpingectomy: ovaries still in.  (done for DUB).    Current Medications: Current Meds  Medication Sig  . Calcium-Vitamin D-Vitamin K (CALCIUM SOFT CHEWS PO) Take 1 each by mouth daily.  Marland Kitchen levothyroxine (SYNTHROID, LEVOTHROID) 50 MCG tablet Take 50 mcg by mouth daily.  Marland Kitchen tretinoin (RETIN-A) 0.05 % cream Apply topically at bedtime.     Allergies:   Patient has no known  allergies.   Social History   Socioeconomic History  . Marital status: Married    Spouse name: Not on file  . Number of children: Not on file  . Years of education: Not on file  . Highest education level: Not on file  Occupational History  . Not on file  Social Needs  . Financial resource strain: Not on file  . Food insecurity:    Worry: Not on file    Inability: Not on file  . Transportation needs:    Medical: Not on file    Non-medical: Not on file  Tobacco Use  . Smoking status: Former Smoker    Last attempt to quit: 06/14/2010    Years since quitting: 7.8  . Smokeless tobacco: Never Used  Substance and Sexual Activity  . Alcohol use: Yes    Alcohol/week: 2.0 standard drinks    Types: 2 Cans of beer per week    Comment: twice weekly  . Drug use: No  . Sexual activity: Not on file  Lifestyle  . Physical activity:    Days per week: Not on file    Minutes per session: Not on file  . Stress: Not on file  Relationships  . Social connections:    Talks on phone: Not on file    Gets together: Not on file    Attends religious service: Not on file    Active member of club or organization: Not on file    Attends meetings of clubs or organizations: Not on file    Relationship status: Not on file  Other Topics Concern  . Not on file  Social History Narrative   Married, has 1 son and 1 daughter.   Occupation: stay at home mom.   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   Lives in [place].   Tob: 5 pack-yr hx, quit 2012.   Alc: occasional beer.   No drugs.   Exercise: 3-4 days a week : running and/or aerobics.     Family History: The patient's family history includes Alzheimer's disease in her paternal grandmother; Heart attack in her paternal grandfather.  ROS:   Please see the history of present illness.     All other systems reviewed and are negative.  EKGs/Labs/Other Studies Reviewed:    The following studies were reviewed today:   EKG:   EKG is  ordered today.  The ekg ordered today demonstrates NSR, normal tracing.  Recent Labs: 07/16/2017: ALT 12; BUN 11; Creatinine, Ser 0.73; Hemoglobin 13.9; Platelets 198.0; Potassium 4.1; Sodium 139; TSH 1.96  Recent Lipid Panel    Component Value Date/Time   CHOL 170 07/16/2017 1155   TRIG 66.0 07/16/2017 1155   HDL 52.30 07/16/2017 1155   CHOLHDL 3 07/16/2017 1155   VLDL 13.2 07/16/2017 1155  LDLCALC 104 (H) 07/16/2017 1155    Physical Exam:    VS:  BP 114/76 (BP Location: Right Arm)   Pulse 61   Ht 5\' 3"  (1.6 m)   Wt 168 lb (76.2 kg)   LMP 06/17/2013   BMI 29.76 kg/m     Wt Readings from Last 3 Encounters:  04/12/18 168 lb (76.2 kg)  07/16/17 163 lb (73.9 kg)  07/12/16 165 lb 8 oz (75.1 kg)     GEN: Appears fit, although mildly overweight, well nourished, well developed in no acute distress HEENT: Normal NECK: No JVD; No carotid bruits LYMPHATICS: No lymphadenopathy CARDIAC: RRR, no murmurs, rubs, gallops RESPIRATORY:  Clear to auscultation without rales, wheezing or rhonchi  ABDOMEN: Soft, non-tender, non-distended MUSCULOSKELETAL:  No edema; No deformity  SKIN: Warm and dry NEUROLOGIC:  Alert and oriented x 3 PSYCHIATRIC:  Normal affect   ASSESSMENT:    No diagnosis found. PLAN:    In order of problems listed above:  1. Chest pain: The occurrence with physical activity is concerning, although her coronary risk factors are nil.  The resting EKG is normal.  Her physical exam does not suggest any valvular abnormalities.  Consider pulmonary hypertension, but she is not particularly short of breath.  We will schedule for stress echocardiogram as well as a complete echo.  However, the more I spoke with Marchelle FolksAmanda, the more I seemed to feel that the Apple Watch reported tachycardia was causing her more concerned than the chest pain. 2. Tachycardia: She is unaware of palpitations so it is hard to say whether this is sinus tachycardia or a true arrhythmia.  We will  have her wear a 24-hour Holter monitor since the events occur on a daily basis.  We will also see the heart rate response to physical activity objectively during her stress test.  It would be nice to have her compare the readings on her apple watch with a Holter monitor, to make sure the tachycardia reported by her device is not an artifact.   Medication Adjustments/Labs and Tests Ordered: Current medicines are reviewed at length with the patient today.  Concerns regarding medicines are outlined above.  No orders of the defined types were placed in this encounter.  No orders of the defined types were placed in this encounter.   There are no Patient Instructions on file for this visit.   Signed, Thurmon FairMihai Aidyn Sportsman, MD  04/12/2018 2:24 PM    [facility]

## 2018-04-13 ENCOUNTER — Encounter: Payer: Self-pay | Admitting: Cardiovascular Disease

## 2018-04-15 ENCOUNTER — Telehealth (HOSPITAL_COMMUNITY): Payer: Self-pay | Admitting: *Deleted

## 2018-04-15 NOTE — Telephone Encounter (Signed)
Left message on voicemail per DPR in reference to upcoming appointment scheduled on 04/17/18 at 3:00 with detailed instructions given per Stress Test Requisition Sheet for the test. LM to arrive 30 minutes early, and that it is imperative to arrive on time for appointment to keep from having the test rescheduled. If you need to cancel or reschedule your appointment, please call the office within 24 hours of your appointment. Failure to do so may result in a cancellation of your appointment, and a $50 no show fee. Phone number given for call back for any questions. Sydney Ramsey

## 2018-04-17 ENCOUNTER — Ambulatory Visit (HOSPITAL_BASED_OUTPATIENT_CLINIC_OR_DEPARTMENT_OTHER): Payer: PRIVATE HEALTH INSURANCE

## 2018-04-17 ENCOUNTER — Ambulatory Visit (HOSPITAL_COMMUNITY): Payer: PRIVATE HEALTH INSURANCE | Attending: Cardiology

## 2018-04-17 DIAGNOSIS — R079 Chest pain, unspecified: Secondary | ICD-10-CM | POA: Diagnosis present

## 2018-04-17 HISTORY — PX: OTHER SURGICAL HISTORY: SHX169

## 2018-04-18 ENCOUNTER — Encounter: Payer: Self-pay | Admitting: Family Medicine

## 2018-05-02 ENCOUNTER — Ambulatory Visit: Payer: PRIVATE HEALTH INSURANCE

## 2018-05-02 ENCOUNTER — Encounter

## 2018-05-07 ENCOUNTER — Ambulatory Visit (INDEPENDENT_AMBULATORY_CARE_PROVIDER_SITE_OTHER): Payer: PRIVATE HEALTH INSURANCE

## 2018-05-07 ENCOUNTER — Ambulatory Visit: Payer: PRIVATE HEALTH INSURANCE | Admitting: Adult Health

## 2018-05-07 ENCOUNTER — Telehealth: Payer: Self-pay

## 2018-05-07 DIAGNOSIS — R000 Tachycardia, unspecified: Secondary | ICD-10-CM

## 2018-05-07 HISTORY — PX: OTHER SURGICAL HISTORY: SHX169

## 2018-05-07 HISTORY — DX: Tachycardia, unspecified: R00.0

## 2018-05-07 NOTE — Telephone Encounter (Signed)
Pt was to come in today to review monitor results contacted [facility] for monitor results there was a Problem with monitor have to redo/ reschedule.  Did not record data.   Would not have results be today anyway.  Companies have at least 48 business day hours to scan.  We can apply another monitor today when she comes in.  Let me know.  (Thanks, Shelly)  [id number] TO [facility] INSTEAD FOR MONITOR PLACEMENT.

## 2018-05-07 NOTE — Telephone Encounter (Signed)
S/W PT SHE WILL GO TO [facility] FOR MONITOR PLACEMENT

## 2018-05-07 NOTE — Progress Notes (Deleted)
Cardiology Office Note   Date:  05/07/2018   ID:  Sydney Ramsey, DOB September 18, 1980, MRN [id number]  PCP:  Jeoffrey Massed, MD  Cardiologist: Croitoru  No chief complaint on file.    History of Present Illness: Sydney Ramsey is a 38 y.o. female who presents for     Past Medical History:  Diagnosis Date  . Capsulitis of foot 2018   L forefoot (Dr. Charlsie Merles).  Orthotics.  . Iron deficiency anemia    Hx of; has heavy menses  (low iron/ferritin 06/2013)  . Menorrhagia    [name]  . Thyroid nodule    Has been euthyroid, but Dr. Talmage Nap has had her on levothyroxine suppression.    Past Surgical History:  Procedure Laterality Date  . BREAST BIOPSY Right 07/08/14   Fibroadenoma  . BUNIONECTOMY Left   . CESAREAN SECTION    . exercis stress echo  04/17/2018   NORMAL  . LASIK  2008 approx  . VAGINAL HYSTERECTOMY     with bilat salpingectomy: ovaries still in.  (done for DUB).     Current Outpatient Medications  Medication Sig Dispense Refill  . Calcium-Vitamin D-Vitamin K (CALCIUM SOFT CHEWS PO) Take 1 each by mouth daily.    Marland Kitchen levothyroxine (SYNTHROID, LEVOTHROID) 50 MCG tablet Take 50 mcg by mouth daily.  12  . tretinoin (RETIN-A) 0.05 % cream Apply topically at bedtime. 45 g 3   No current facility-administered medications for this visit.     Allergies:   Patient has no known allergies.    Social History:  The patient  reports that she quit smoking about 7 years ago. She has never used smokeless tobacco. She reports current alcohol use of about 2.0 standard drinks of alcohol per week. She reports that she does not use drugs.   Family History:  The patient's family history includes Alzheimer's disease in her paternal grandmother; Heart attack in her paternal grandfather.    ROS: All other systems are reviewed and negative. Unless otherwise mentioned in H&P    PHYSICAL EXAM: VS:  LMP 06/17/2013  , BMI There is no height or weight on file to calculate BMI. GEN:  Well nourished, well developed, in no acute distress HEENT: normal Neck: no JVD, carotid bruits, or masses Cardiac: ***RRR; no murmurs, rubs, or gallops,no edema  Respiratory:  Clear to auscultation bilaterally, normal work of breathing GI: soft, nontender, nondistended, + BS MS: no deformity or atrophy Skin: warm and dry, no rash Neuro:  Strength and sensation are intact Psych: euthymic mood, full affect   EKG:  EKG {ACTION; IS/IS [id number]} ordered today. The ekg ordered today demonstrates ***   Recent Labs: 07/16/2017: ALT 12; BUN 11; Creatinine, Ser 0.73; Hemoglobin 13.9; Platelets 198.0; Potassium 4.1; Sodium 139; TSH 1.96    Lipid Panel    Component Value Date/Time   CHOL 170 07/16/2017 1155   TRIG 66.0 07/16/2017 1155   HDL 52.30 07/16/2017 1155   CHOLHDL 3 07/16/2017 1155   VLDL 13.2 07/16/2017 1155   LDLCALC 104 (H) 07/16/2017 1155      Wt Readings from Last 3 Encounters:  04/12/18 168 lb (76.2 kg)  07/16/17 163 lb (73.9 kg)  07/12/16 165 lb 8 oz (75.1 kg)      Other studies Reviewed: Additional studies/ records that were reviewed today include: ***. Review of the above records demonstrates: ***   ASSESSMENT AND PLAN:  1.  ***   Current medicines are reviewed at length with the patient  today.    Labs/ tests ordered today include: *** Bettey Mare. Liborio Nixon, ANP, AACC   05/07/2018 8:01 AM    [facility] [address] Office [phone] Fax [phone]

## 2018-05-15 ENCOUNTER — Encounter: Payer: Self-pay | Admitting: Family Medicine

## 2018-09-03 DIAGNOSIS — E079 Disorder of thyroid, unspecified: Secondary | ICD-10-CM | POA: Insufficient documentation

## 2019-05-06 ENCOUNTER — Encounter: Payer: Self-pay | Admitting: Family Medicine

## 2019-05-06 ENCOUNTER — Ambulatory Visit (INDEPENDENT_AMBULATORY_CARE_PROVIDER_SITE_OTHER): Payer: 59 | Admitting: Family Medicine

## 2019-05-06 ENCOUNTER — Other Ambulatory Visit: Payer: Self-pay

## 2019-05-06 VITALS — BP 115/76 | HR 67 | Temp 98.4°F | Resp 16 | Ht 63.0 in | Wt 171.6 lb

## 2019-05-06 DIAGNOSIS — H6983 Other specified disorders of Eustachian tube, bilateral: Secondary | ICD-10-CM | POA: Diagnosis not present

## 2019-05-06 NOTE — Progress Notes (Signed)
OFFICE VISIT  05/06/2019   CC:  Chief Complaint  Patient presents with  . Ears full    ringing, sound sensitivity, left feels more full than right    HPI:    Patient is a 39 y.o. Caucasian female who presents for ears feeling full, L>R. About 2 mo has noted this, also a bit of disequilibrium briefly but NO VERTIGO, some feeling of fluid moving in ears sometimes.   Sensitivity to sounds noted.  No decreased hearing.  Occ humming.  No nasal congestion or sinus fullness.  No signif PND. Decongestant helps (claritin D).  No fevers, no recent URI.    Past Medical History:  Diagnosis Date  . Capsulitis of foot 2018   L forefoot (Dr. Charlsie Merles).  Orthotics.  . Chest pain    stress echo normal 03/2018  . Iron deficiency anemia    Hx of; has heavy menses  (low iron/ferritin 06/2013)  . Menorrhagia    [name]  . Tachycardia 05/07/2018   Elevated HR noted on her wrist pulse monitor.  Sinus tach on Holter   . Thyroid nodule    Has been euthyroid, but Dr. Talmage Nap has had her on levothyroxine suppression.    Past Surgical History:  Procedure Laterality Date  . BREAST BIOPSY Right 07/08/14   Fibroadenoma  . BUNIONECTOMY Left   . CESAREAN SECTION    . exercis stress echo  04/17/2018   NORMAL  . HOLTER (24h)  05/07/2018   sinus tachy  . LASIK  2008 approx  . VAGINAL HYSTERECTOMY     with bilat salpingectomy: ovaries still in.  (done for DUB).    Outpatient Medications Prior to Visit  Medication Sig Dispense Refill  . Calcium-Vitamin D-Vitamin K (CALCIUM SOFT CHEWS PO) Take 1 each by mouth daily.    Marland Kitchen levothyroxine (SYNTHROID) 100 MCG tablet Take 100 mcg by mouth daily.   12  . tretinoin (RETIN-A) 0.05 % cream Apply topically at bedtime. 45 g 3   No facility-administered medications prior to visit.    No Known Allergies  ROS As per HPI  PE: Blood pressure 115/76, pulse 67, temperature 98.4 F (36.9 C), temperature source Temporal, resp. rate 16, height 5\' 3"  (1.6 m),  weight 171 lb 9.6 oz (77.8 kg), last menstrual period 06/17/2013, SpO2 96 %. Gen: Alert, well appearing.  Patient is oriented to person, place, time, and situation. AFFECT: pleasant, lucid thought and speech. ENT: Ears: EACs clear, normal epithelium.  TMs with good light reflex and landmarks bilaterally.  Eyes: no injection, icteris, swelling, or exudate.  EOMI, PERRLA. Nose: no drainage or turbinate edema/swelling.  No injection or focal lesion.  Mouth: lips without lesion/swelling.  Oral mucosa pink and moist.  Dentition intact and without obvious caries or gingival swelling.  Oropharynx without erythema, exudate, or swelling.    LABS:    Chemistry      Component Value Date/Time   NA 139 07/16/2017 1155   K 4.1 07/16/2017 1155   CL 102 07/16/2017 1155   CO2 30 07/16/2017 1155   BUN 11 07/16/2017 1155   CREATININE 0.73 07/16/2017 1155      Component Value Date/Time   CALCIUM 9.3 07/16/2017 1155   ALKPHOS 47 07/16/2017 1155   AST 18 07/16/2017 1155   ALT 12 07/16/2017 1155   BILITOT 1.0 07/16/2017 1155     Lab Results  Component Value Date   TSH 1.96 07/16/2017    IMPRESSION AND PLAN:  1) Suspect eustachian tube dysfunction.  Trial of otc flonase and continue zyrtec D. If no improvement in 7-10d then I told her next step is to have ENT see her.  An After Visit Summary was printed and given to the patient.  FOLLOW UP: Return if symptoms worsen or fail to improve.  Signed:  Crissie Sickles, MD           05/06/2019

## 2019-06-19 DIAGNOSIS — R928 Other abnormal and inconclusive findings on diagnostic imaging of breast: Secondary | ICD-10-CM

## 2019-06-19 HISTORY — DX: Other abnormal and inconclusive findings on diagnostic imaging of breast: R92.8

## 2019-06-24 ENCOUNTER — Other Ambulatory Visit: Payer: Self-pay | Admitting: Obstetrics and Gynecology

## 2019-06-24 DIAGNOSIS — Z1231 Encounter for screening mammogram for malignant neoplasm of breast: Secondary | ICD-10-CM

## 2019-07-01 ENCOUNTER — Ambulatory Visit
Admission: RE | Admit: 2019-07-01 | Discharge: 2019-07-01 | Disposition: A | Discharge status: Home / Self Care | Payer: 59 | Source: Ambulatory Visit | Attending: Obstetrics and Gynecology | Admitting: Obstetrics and Gynecology

## 2019-07-01 ENCOUNTER — Other Ambulatory Visit: Payer: Self-pay

## 2019-07-01 DIAGNOSIS — Z1231 Encounter for screening mammogram for malignant neoplasm of breast: Secondary | ICD-10-CM

## 2019-07-02 ENCOUNTER — Encounter: Payer: Self-pay | Admitting: Family Medicine

## 2019-07-02 ENCOUNTER — Other Ambulatory Visit: Payer: Self-pay | Admitting: Obstetrics and Gynecology

## 2019-07-02 DIAGNOSIS — R928 Other abnormal and inconclusive findings on diagnostic imaging of breast: Secondary | ICD-10-CM

## 2019-07-14 ENCOUNTER — Ambulatory Visit: Payer: 59

## 2019-07-14 ENCOUNTER — Ambulatory Visit
Admission: RE | Admit: 2019-07-14 | Discharge: 2019-07-14 | Disposition: A | Discharge status: Home / Self Care | Payer: 59 | Source: Ambulatory Visit | Attending: Obstetrics and Gynecology | Admitting: Obstetrics and Gynecology

## 2019-07-14 ENCOUNTER — Other Ambulatory Visit: Payer: Self-pay

## 2019-07-14 DIAGNOSIS — R928 Other abnormal and inconclusive findings on diagnostic imaging of breast: Secondary | ICD-10-CM

## 2019-09-08 DIAGNOSIS — R87619 Unspecified abnormal cytological findings in specimens from cervix uteri: Secondary | ICD-10-CM | POA: Insufficient documentation

## 2019-12-01 ENCOUNTER — Other Ambulatory Visit: Payer: Self-pay | Admitting: Endocrinology

## 2019-12-01 DIAGNOSIS — E039 Hypothyroidism, unspecified: Secondary | ICD-10-CM

## 2020-02-09 ENCOUNTER — Encounter: Payer: 59 | Admitting: Family Medicine

## 2020-03-03 ENCOUNTER — Other Ambulatory Visit: Payer: Self-pay

## 2020-03-03 DIAGNOSIS — E049 Nontoxic goiter, unspecified: Secondary | ICD-10-CM | POA: Insufficient documentation

## 2020-03-04 ENCOUNTER — Encounter: Payer: Self-pay | Admitting: Family Medicine

## 2020-03-04 ENCOUNTER — Other Ambulatory Visit: Payer: Self-pay

## 2020-03-04 ENCOUNTER — Ambulatory Visit (INDEPENDENT_AMBULATORY_CARE_PROVIDER_SITE_OTHER): Payer: 59 | Admitting: Family Medicine

## 2020-03-04 VITALS — BP 121/73 | HR 86 | Temp 97.9°F | Ht 62.75 in | Wt 170.0 lb

## 2020-03-04 DIAGNOSIS — Z0000 Encounter for general adult medical examination without abnormal findings: Secondary | ICD-10-CM

## 2020-03-04 DIAGNOSIS — M25551 Pain in right hip: Secondary | ICD-10-CM

## 2020-03-04 DIAGNOSIS — G8929 Other chronic pain: Secondary | ICD-10-CM

## 2020-03-04 DIAGNOSIS — E669 Obesity, unspecified: Secondary | ICD-10-CM

## 2020-03-04 DIAGNOSIS — E063 Autoimmune thyroiditis: Secondary | ICD-10-CM

## 2020-03-04 DIAGNOSIS — M25552 Pain in left hip: Secondary | ICD-10-CM

## 2020-03-04 DIAGNOSIS — M545 Low back pain, unspecified: Secondary | ICD-10-CM | POA: Diagnosis not present

## 2020-03-04 NOTE — Progress Notes (Signed)
Office Note 03/04/2020  CC:  Chief Complaint  Patient presents with  . Annual Exam    HPI:  Sydney Ramsey is a 39 y.o. White female who is here for annual health maintenance exam. I last saw her for CPE in 2019. A/P as of that visit: "Health maintenance exam: Reviewed age and gender appropriate health maintenance issues (prudent diet, regular exercise, health risks of tobacco and excessive alcohol, use of seatbelts, fire alarms in home, use of sunscreen).  Also reviewed age and gender appropriate health screening as well as vaccine recommendations. Vaccines: UTD. Labs: fasting HP labs today. Cervical ca screening: per GYN, [name] (04/2017)-normal pap last year, next is due next year (even though she does not have a cervix). Breast ca screening: per GYN, [name] (04/2017) she will start annual mammo at age 73 yrs."  INTERIM HX: Feeling well. Working out 3x/week. Diet fair.  Says chronic LBP getting more problematic/severe of late, middle of L spine, worse after long period of sitting, no radiation into hips or legs, no paresthesias, no weakness of legs. More restricted in lifting/bending at L spine in her workouts, takes ibup sometimes and helps minimally.  Hx of a lumbar disc degen, says she got a few steroid injections by Dr. Ethelene Hal in the way distant past that helped.  Has bothered her some since then but gradually becoming more persistent and severe. Also with more bilat hip pains last 1 year or so, worse the last few months.  Sometimes at night she can't get comfortable at all.  Says hips not seemingly related to back pain. Some days he hips don't bother her at all.   No hx of back or pelvic or hip trauma.   Past Medical History:  Diagnosis Date  . Abnormal mammogram of both breasts 06/2019   ?asymmetry->diagnostic mammos to be done  . Capsulitis of foot 2018   L forefoot (Dr. Charlsie Merles).  Orthotics.  . Chest pain    stress echo normal 03/2018  . Iron deficiency  anemia    Hx of; has heavy menses  (low iron/ferritin 06/2013)  . Menorrhagia    [name]  . Tachycardia 05/07/2018   Elevated HR noted on her wrist pulse monitor.  Sinus tach on Holter   . Thyroid nodule    Has been euthyroid, but Dr. Talmage Nap has had her on levothyroxine suppression.    Past Surgical History:  Procedure Laterality Date  . BREAST BIOPSY Right 07/08/14   Fibroadenoma  . BUNIONECTOMY Left   . CESAREAN SECTION    . exercis stress echo  04/17/2018   NORMAL  . HOLTER (24h)  05/07/2018   sinus tachy  . LASIK  2008 approx  . VAGINAL HYSTERECTOMY     with bilat salpingectomy: ovaries still in.  (done for DUB).    Family History  Problem Relation Age of Onset  . Alzheimer's disease Paternal Grandmother   . Heart attack Paternal Grandfather     Social History   Socioeconomic History  . Marital status: Married    Spouse name: Not on file  . Number of children: Not on file  . Years of education: Not on file  . Highest education level: Not on file  Occupational History  . Not on file  Tobacco Use  . Smoking status: Former Smoker    Quit date: 06/14/2010    Years since quitting: 9.7  . Smokeless tobacco: Never Used  Vaping Use  . Vaping Use: Never used  Substance  and Sexual Activity  . Alcohol use: Yes    Alcohol/week: 2.0 standard drinks    Types: 2 Cans of beer per week    Comment: twice weekly  . Drug use: No  . Sexual activity: Not on file  Other Topics Concern  . Not on file  Social History Narrative   Married, has 1 son and 1 daughter.   Occupation: stay at home mom.   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   Lives in [place].   Tob: 5 pack-yr hx, quit 2012.   Alc: occasional beer.   No drugs.   Exercise: 3-4 days a week : running and/or aerobics.   Social Determinants of Health   Financial Resource Strain: Not on file  Food Insecurity: Not on file  Transportation Needs: Not on file  Physical Activity: Not on file   Stress: Not on file  Social Connections: Not on file  Intimate Partner Violence: Not on file    Outpatient Medications Prior to Visit  Medication Sig Dispense Refill  . Calcium-Vitamin D-Vitamin K (CALCIUM SOFT CHEWS PO) Take 1 each by mouth daily.    Marland Kitchen levothyroxine (SYNTHROID) 100 MCG tablet Take 100 mcg by mouth daily.   12  . OVER THE COUNTER MEDICATION at bedtime. Musely spot cream; melasma. Use a pea size amount(1 full pump) to spots nightly.     No facility-administered medications prior to visit.    No Known Allergies  ROS Review of Systems  Constitutional: Negative for appetite change, chills, fatigue and fever.  HENT: Negative for congestion, dental problem, ear pain and sore throat.   Eyes: Negative for discharge, redness and visual disturbance.  Respiratory: Negative for cough, chest tightness, shortness of breath and wheezing.   Cardiovascular: Negative for chest pain, palpitations and leg swelling.  Gastrointestinal: Negative for abdominal pain, blood in stool, diarrhea, nausea and vomiting.  Genitourinary: Negative for difficulty urinating, dysuria, flank pain, frequency, hematuria and urgency.  Musculoskeletal: Positive for arthralgias (bilat hips--see hpi) and back pain (chronic midline w/out radiation--see hpi). Negative for joint swelling, myalgias and neck stiffness.  Skin: Negative for pallor and rash.  Neurological: Negative for dizziness, speech difficulty, weakness and headaches.  Hematological: Negative for adenopathy. Does not bruise/bleed easily.  Psychiatric/Behavioral: Negative for confusion and sleep disturbance. The patient is not nervous/anxious.     PE; Vitals with BMI 03/04/2020 05/06/2019 04/12/2018  Height 5' 2.75" 5\' 3"  -  Weight 170 lbs 171 lbs 10 oz -  BMI 30.35 30.41 -  Systolic 121 115  Diastolic 73 76 76  Pulse 86 67 -   Exam chaperoned by 001, CMA.  Gen: Alert, well appearing.  Patient is oriented to person, place,  time, and situation. AFFECT: pleasant, lucid thought and speech. ENT: Ears: EACs clear, normal epithelium.  TMs with good light reflex and landmarks bilaterally.  Eyes: no injection, icteris, swelling, or exudate.  EOMI, PERRLA. Nose: no drainage or turbinate edema/swelling.  No injection or focal lesion.  Mouth: lips without lesion/swelling.  Oral mucosa pink and moist.  Dentition intact and without obvious caries or gingival swelling.  Oropharynx without erythema, exudate, or swelling.  Neck: supple/nontender.  No LAD, mass, or TM.  Carotid pulses 2+ bilaterally, without bruits. CV: RRR, no m/r/g.   LUNGS: CTA bilat, nonlabored resps, good aeration in all lung fields. ABD: soft, NT, ND, BS normal.  No hepatospenomegaly or mass.  No bruits. EXT: no clubbing, cyanosis, or edema.  Musculoskeletal: no  joint swelling, erythema, warmth, or tenderness.  ROM of all joints intact. Skin - no sores or suspicious lesions or rashes or color changes   Pertinent labs:  Lab Results  Component Value Date   TSH 1.96 07/16/2017   Lab Results  Component Value Date   WBC 5.8 07/16/2017   HGB 13.9 07/16/2017   HCT 40.9 07/16/2017   MCV 88.5 07/16/2017   PLT 198.0 07/16/2017   Lab Results  Component Value Date   CREATININE 0.73 07/16/2017   BUN 11 07/16/2017   NA 139 07/16/2017   K 4.1 07/16/2017   CL 102 07/16/2017   CO2 30 07/16/2017   Lab Results  Component Value Date   ALT 12 07/16/2017   AST 18 07/16/2017   ALKPHOS 47 07/16/2017   BILITOT 1.0 07/16/2017   Lab Results  Component Value Date   CHOL 170 07/16/2017   Lab Results  Component Value Date   HDL 52.30 07/16/2017   Lab Results  Component Value Date   LDLCALC 104 (H) 07/16/2017   Lab Results  Component Value Date   TRIG 66.0 07/16/2017   Lab Results  Component Value Date   CHOLHDL 3 07/16/2017    ASSESSMENT AND PLAN:   1) Chronic midline LBP w/out radiculopathy. Worsening, pt using NSAIDs some but not much  help. No sign of acute injury, no imaging indicated at this time. She is interested in referral back to Dr. Ethelene Hal for further evaluation so I ordered this referral today.  2) Bilat hip pains: intermittent, suspect more overuse-related. Definitely needs to stretch more. No imaging at this time.  3) Health maintenance exam: Reviewed age and gender appropriate health maintenance issues (prudent diet, regular exercise, health risks of tobacco and excessive alcohol, use of seatbelts, fire alarms in home, use of sunscreen).  Also reviewed age and gender appropriate health screening as well as vaccine recommendations. Vaccines:  Covid UTD.  Tdap UTD.  Flu->declined for now---will get covid booster first. Labs: fasting HP ordered. Cervical ca screening: hx of hysterectomy for DUB->gets paps/pelvics via GYN ([name]). Breast ca screening: 06/2019 mammo abnl->diagnost mammo and u/s NEG/NL-->plan rpt 06/2020 ([name]). Colon ca screening: average risk patient= as per latest guidelines, start screening at 45-50 yrs of age.  An After Visit Summary was printed and given to the patient.  FOLLOW UP:  Return in about 1 year (around 03/04/2021) for annual CPE (fasting).  Signed:  Santiago Bumpers, MD           03/04/2020

## 2020-03-04 NOTE — Patient Instructions (Signed)
Health Maintenance, Female Adopting a healthy lifestyle and getting preventive care are important in promoting health and wellness. Ask your health care provider about:  The right schedule for you to have regular tests and exams.  Things you can do on your own to prevent diseases and keep yourself healthy. What should I know about diet, weight, and exercise? Eat a healthy diet   Eat a diet that includes plenty of vegetables, fruits, low-fat dairy products, and lean protein.  Do not eat a lot of foods that are high in solid fats, added sugars, or sodium. Maintain a healthy weight Body mass index (BMI) is used to identify weight problems. It estimates body fat based on height and weight. Your health care provider can help determine your BMI and help you achieve or maintain a healthy weight. Get regular exercise Get regular exercise. This is one of the most important things you can do for your health. Most adults should:  Exercise for at least 150 minutes each week. The exercise should increase your heart rate and make you sweat (moderate-intensity exercise).  Do strengthening exercises at least twice a week. This is in addition to the moderate-intensity exercise.  Spend less time sitting. Even light physical activity can be beneficial. Watch cholesterol and blood lipids Have your blood tested for lipids and cholesterol at 40 years of age, then have this test every 5 years. Have your cholesterol levels checked more often if:  Your lipid or cholesterol levels are high.  You are older than 40 years of age.  You are at high risk for heart disease. What should I know about cancer screening? Depending on your health history and family history, you may need to have cancer screening at various ages. This may include screening for:  Breast cancer.  Cervical cancer.  Colorectal cancer.  Skin cancer.  Lung cancer. What should I know about heart disease, diabetes, and high blood  pressure? Blood pressure and heart disease  High blood pressure causes heart disease and increases the risk of stroke. This is more likely to develop in people who have high blood pressure readings, are of African descent, or are overweight.  Have your blood pressure checked: ? Every 3-5 years if you are 18-39 years of age. ? Every year if you are 40 years old or older. Diabetes Have regular diabetes screenings. This checks your fasting blood sugar level. Have the screening done:  Once every three years after age 40 if you are at a normal weight and have a low risk for diabetes.  More often and at a younger age if you are overweight or have a high risk for diabetes. What should I know about preventing infection? Hepatitis B If you have a higher risk for hepatitis B, you should be screened for this virus. Talk with your health care provider to find out if you are at risk for hepatitis B infection. Hepatitis C Testing is recommended for:  Everyone born from 1945 through 1965.  Anyone with known risk factors for hepatitis C. Sexually transmitted infections (STIs)  Get screened for STIs, including gonorrhea and chlamydia, if: ? You are sexually active and are younger than 40 years of age. ? You are older than 40 years of age and your health care provider tells you that you are at risk for this type of infection. ? Your sexual activity has changed since you were last screened, and you are at increased risk for chlamydia or gonorrhea. Ask your health care provider if   you are at risk.  Ask your health care provider about whether you are at high risk for HIV. Your health care provider may recommend a prescription medicine to help prevent HIV infection. If you choose to take medicine to prevent HIV, you should first get tested for HIV. You should then be tested every 3 months for as long as you are taking the medicine. Pregnancy  If you are about to stop having your period (premenopausal) and  you may become pregnant, seek counseling before you get pregnant.  Take 400 to 800 micrograms (mcg) of folic acid every day if you become pregnant.  Ask for birth control (contraception) if you want to prevent pregnancy. Osteoporosis and menopause Osteoporosis is a disease in which the bones lose minerals and strength with aging. This can result in bone fractures. If you are 65 years old or older, or if you are at risk for osteoporosis and fractures, ask your health care provider if you should:  Be screened for bone loss.  Take a calcium or vitamin D supplement to lower your risk of fractures.  Be given hormone replacement therapy (HRT) to treat symptoms of menopause. Follow these instructions at home: Lifestyle  Do not use any products that contain nicotine or tobacco, such as cigarettes, e-cigarettes, and chewing tobacco. If you need help quitting, ask your health care provider.  Do not use street drugs.  Do not share needles.  Ask your health care provider for help if you need support or information about quitting drugs. Alcohol use  Do not drink alcohol if: ? Your health care provider tells you not to drink. ? You are pregnant, may be pregnant, or are planning to become pregnant.  If you drink alcohol: ? Limit how much you use to 0-1 drink a day. ? Limit intake if you are breastfeeding.  Be aware of how much alcohol is in your drink. In the [place] one drink equals one 12 oz bottle of beer (355 mL), one 5 oz glass of wine (148 mL), or one 1 oz glass of hard liquor (44 mL). General instructions  Schedule regular health, dental, and eye exams.  Stay current with your vaccines.  Tell your health care provider if: ? You often feel depressed. ? You have ever been abused or do not feel safe at home. Summary  Adopting a healthy lifestyle and getting preventive care are important in promoting health and wellness.  Follow your health care provider's instructions about healthy  diet, exercising, and getting tested or screened for diseases.  Follow your health care provider's instructions on monitoring your cholesterol and blood pressure. This information is not intended to replace advice given to you by your health care provider. Make sure you discuss any questions you have with your health care provider. Document Revised: 02/27/2018 Document Reviewed: 02/27/2018 Elsevier Patient Education  2020 [employer]  

## 2020-03-05 LAB — COMPREHENSIVE METABOLIC PANEL
ALT: 14 U/L (ref 0–35)
AST: 16 U/L (ref 0–37)
Albumin: 4.3 g/dL (ref 3.5–5.2)
Alkaline Phosphatase: 45 U/L (ref 39–117)
BUN: 15 mg/dL (ref 6–23)
CO2: 26 mEq/L (ref 19–32)
Calcium: 9.2 mg/dL (ref 8.4–10.5)
Chloride: 104 mEq/L (ref 96–112)
Creatinine, Ser: 0.8 mg/dL (ref 0.40–1.20)
GFR: 91.91 mL/min (ref >60.00)
Glucose, Bld: 93 mg/dL (ref 70–99)
Potassium: 4 mEq/L (ref 3.5–5.1)
Sodium: 138 mEq/L (ref 135–145)
Total Bilirubin: 0.6 mg/dL (ref 0.2–1.2)
Total Protein: 6.8 g/dL (ref 6.0–8.3)

## 2020-03-05 LAB — CBC WITH DIFFERENTIAL/PLATELET
Basophils Absolute: 0.1 10*3/uL (ref 0.0–0.1)
Basophils Relative: 1.1 % (ref 0.0–3.0)
Eosinophils Absolute: 0.1 10*3/uL (ref 0.0–0.7)
Eosinophils Relative: 2.1 % (ref 0.0–5.0)
HCT: 39.9 % (ref 36.0–46.0)
Hemoglobin: 13.3 g/dL (ref 12.0–15.0)
Lymphocytes Relative: 29.3 % (ref 12.0–46.0)
Lymphs Abs: 2 10*3/uL (ref 0.7–4.0)
MCHC: 33.5 g/dL (ref 30.0–36.0)
MCV: 89 fl (ref 78.0–100.0)
Monocytes Absolute: 0.4 10*3/uL (ref 0.1–1.0)
Monocytes Relative: 5.6 % (ref 3.0–12.0)
Neutro Abs: 4.2 10*3/uL (ref 1.4–7.7)
Neutrophils Relative %: 61.9 % (ref 43.0–77.0)
Platelets: 214 10*3/uL (ref 150.0–400.0)
RBC: 4.48 Mil/uL (ref 3.87–5.11)
RDW: 12.4 % (ref 11.5–15.5)
WBC: 6.7 10*3/uL (ref 4.0–10.5)

## 2020-03-05 LAB — LIPID PANEL
Cholesterol: 180 mg/dL (ref 0–200)
HDL: 50.3 mg/dL (ref >39.00)
LDL Cholesterol: 91 mg/dL (ref 0–99)
NonHDL: 129.4
Total CHOL/HDL Ratio: 4
Triglycerides: 190 mg/dL — ABNORMAL HIGH (ref 0.0–149.0)
VLDL: 38 mg/dL (ref 0.0–40.0)

## 2020-03-05 LAB — TSH: TSH: 0.85 u[IU]/mL (ref 0.35–4.50)

## 2020-05-17 ENCOUNTER — Encounter: Payer: Self-pay | Admitting: Family Medicine

## 2020-06-01 ENCOUNTER — Other Ambulatory Visit: Payer: Self-pay | Admitting: Obstetrics and Gynecology

## 2020-06-01 DIAGNOSIS — Z1231 Encounter for screening mammogram for malignant neoplasm of breast: Secondary | ICD-10-CM

## 2020-06-21 ENCOUNTER — Encounter: Payer: Self-pay | Admitting: Family Medicine

## 2020-07-23 ENCOUNTER — Ambulatory Visit
Admission: RE | Admit: 2020-07-23 | Discharge: 2020-07-23 | Disposition: A | Discharge status: Home / Self Care | Payer: 59 | Source: Ambulatory Visit | Attending: Obstetrics and Gynecology | Admitting: Obstetrics and Gynecology

## 2020-07-23 ENCOUNTER — Other Ambulatory Visit: Payer: Self-pay

## 2020-07-23 DIAGNOSIS — Z1231 Encounter for screening mammogram for malignant neoplasm of breast: Secondary | ICD-10-CM

## 2020-09-08 ENCOUNTER — Other Ambulatory Visit: Payer: Self-pay

## 2020-09-08 ENCOUNTER — Ambulatory Visit (INDEPENDENT_AMBULATORY_CARE_PROVIDER_SITE_OTHER): Payer: 59 | Admitting: Podiatry

## 2020-09-08 ENCOUNTER — Ambulatory Visit (INDEPENDENT_AMBULATORY_CARE_PROVIDER_SITE_OTHER): Payer: 59

## 2020-09-08 ENCOUNTER — Encounter: Payer: Self-pay | Admitting: Podiatry

## 2020-09-08 DIAGNOSIS — M7671 Peroneal tendinitis, right leg: Secondary | ICD-10-CM

## 2020-09-08 DIAGNOSIS — M779 Enthesopathy, unspecified: Secondary | ICD-10-CM

## 2020-09-08 MED ORDER — MELOXICAM 15 MG PO TABS
15.0000 mg | ORAL_TABLET | Freq: Every day | ORAL | 3 refills | Status: DC
Start: 2020-09-08 — End: 2021-03-04

## 2020-09-09 ENCOUNTER — Other Ambulatory Visit: Payer: Self-pay | Admitting: Podiatry

## 2020-09-09 ENCOUNTER — Telehealth: Payer: Self-pay | Admitting: Family Medicine

## 2020-09-09 ENCOUNTER — Encounter: Payer: Self-pay | Admitting: Podiatry

## 2020-09-09 DIAGNOSIS — M7671 Peroneal tendinitis, right leg: Secondary | ICD-10-CM

## 2020-09-09 MED ORDER — SCOPOLAMINE 1 MG/3DAYS TD PT72: 1.0000 | MEDICATED_PATCH | TRANSDERMAL | 1 refills | Status: DC

## 2020-09-09 NOTE — Telephone Encounter (Signed)
Spoke with pt to advise patches sent with 1 additional refill, voiced understanding.

## 2020-09-09 NOTE — Telephone Encounter (Signed)
Rec'd voicemail from patient.  It is a duplicate message to the one Sydney Ramsey has documented from today at 10:36am

## 2020-09-09 NOTE — Telephone Encounter (Signed)
OK, scopolamine patch sent in.

## 2020-09-09 NOTE — Telephone Encounter (Signed)
Pt is going on a boat (catamaran) next week and has a tendency to get sea sick. Pt requesting RX for sea sickness patch. Please advise.   Ph # [phone]  [id number] - [place], [place] - [address] Phone:  [phone]  Fax:  [phone]

## 2020-09-09 NOTE — Progress Notes (Signed)
  Subjective:  Patient ID: Sydney Ramsey, female    DOB: 1980/09/29,  MRN: [id number]  Chief Complaint  Patient presents with   Foot Pain    Patient presents today for right foot pain at base of 5th met x 1 weeks.  She states "it started hurting after I went for a run on vacation and now its a sharp shooting pain when I bear weight on my foot"    40 y.o. female presents with the above complaint. History confirmed with patient.   Objective:  Physical Exam: warm, good capillary refill, no trophic changes or ulcerative lesions, normal DP and PT pulses, and normal sensory exam. Right Foot: Pain on palpation at base of fifth metatarsal and with resisted eversion   Radiographs: Multiple views x-ray of the right foot: no fracture, dislocation, swelling or degenerative changes noted Assessment:   1. Peroneal tendinitis of right lower extremity      Plan:  Patient was evaluated and treated and all questions answered.  Discussed the etiology and treatment options for Peroneal tendinitis including stretching, formal physical therapy with an eccentric exercises therapy plan, supportive shoegears such as a running shoe or sneaker, heel lifts, topical and oral medications.  We also discussed that I do not routinely perform injections in this area because of the risk of an increased damage or rupture of the tendon.  We also discussed the role of surgical treatment of this for patients who do not improve after exhausting non-surgical treatment options.  -XR reviewed with patient -Educated on stretching and icing of the affected limb. -Rx for Meloxicam. Advised on risks, benefits, and alternatives of the medication  Return in about 1 month (around 10/08/2020) for re-check peroneal tendinitis.

## 2020-09-09 NOTE — Telephone Encounter (Signed)
Pt was last seen 03/04/20, no prior rx has been given for patches.   Please Advise.

## 2020-10-12 ENCOUNTER — Ambulatory Visit: Payer: 59 | Admitting: Podiatry

## 2020-11-29 ENCOUNTER — Ambulatory Visit
Admission: RE | Admit: 2020-11-29 | Discharge: 2020-11-29 | Disposition: A | Discharge status: Home / Self Care | Payer: 59 | Source: Ambulatory Visit | Attending: Endocrinology | Admitting: Endocrinology

## 2020-11-29 DIAGNOSIS — E039 Hypothyroidism, unspecified: Secondary | ICD-10-CM

## 2021-03-03 ENCOUNTER — Other Ambulatory Visit: Payer: Self-pay

## 2021-03-04 ENCOUNTER — Ambulatory Visit (INDEPENDENT_AMBULATORY_CARE_PROVIDER_SITE_OTHER): Payer: 59 | Admitting: Family Medicine

## 2021-03-04 ENCOUNTER — Encounter: Payer: Self-pay | Admitting: Family Medicine

## 2021-03-04 VITALS — BP 102/70 | HR 58 | Temp 98.0°F | Ht 62.75 in | Wt 159.0 lb

## 2021-03-04 DIAGNOSIS — E669 Obesity, unspecified: Secondary | ICD-10-CM

## 2021-03-04 DIAGNOSIS — Z0000 Encounter for general adult medical examination without abnormal findings: Secondary | ICD-10-CM

## 2021-03-04 DIAGNOSIS — E063 Autoimmune thyroiditis: Secondary | ICD-10-CM

## 2021-03-04 LAB — COMPREHENSIVE METABOLIC PANEL
ALT: 18 U/L (ref 0–35)
AST: 17 U/L (ref 0–37)
Albumin: 4.2 g/dL (ref 3.5–5.2)
Alkaline Phosphatase: 48 U/L (ref 39–117)
BUN: 12 mg/dL (ref 6–23)
CO2: 28 mEq/L (ref 19–32)
Calcium: 9.7 mg/dL (ref 8.4–10.5)
Chloride: 102 mEq/L (ref 96–112)
Creatinine, Ser: 0.92 mg/dL (ref 0.40–1.20)
GFR: 77.18 mL/min (ref >60.00)
Glucose, Bld: 79 mg/dL (ref 70–99)
Potassium: 4.3 mEq/L (ref 3.5–5.1)
Sodium: 139 mEq/L (ref 135–145)
Total Bilirubin: 0.8 mg/dL (ref 0.2–1.2)
Total Protein: 6.9 g/dL (ref 6.0–8.3)

## 2021-03-04 LAB — CBC WITH DIFFERENTIAL/PLATELET
Basophils Absolute: 0.1 10*3/uL (ref 0.0–0.1)
Basophils Relative: 1 % (ref 0.0–3.0)
Eosinophils Absolute: 0.2 10*3/uL (ref 0.0–0.7)
Eosinophils Relative: 4.2 % (ref 0.0–5.0)
HCT: 40.6 % (ref 36.0–46.0)
Hemoglobin: 13.7 g/dL (ref 12.0–15.0)
Lymphocytes Relative: 30.4 % (ref 12.0–46.0)
Lymphs Abs: 1.6 10*3/uL (ref 0.7–4.0)
MCHC: 33.7 g/dL (ref 30.0–36.0)
MCV: 88.2 fl (ref 78.0–100.0)
Monocytes Absolute: 0.4 10*3/uL (ref 0.1–1.0)
Monocytes Relative: 7.8 % (ref 3.0–12.0)
Neutro Abs: 3 10*3/uL (ref 1.4–7.7)
Neutrophils Relative %: 56.6 % (ref 43.0–77.0)
Platelets: 213 10*3/uL (ref 150.0–400.0)
RBC: 4.61 Mil/uL (ref 3.87–5.11)
RDW: 12.7 % (ref 11.5–15.5)
WBC: 5.3 10*3/uL (ref 4.0–10.5)

## 2021-03-04 LAB — LIPID PANEL
Cholesterol: 182 mg/dL (ref 0–200)
HDL: 58.8 mg/dL (ref >39.00)
LDL Cholesterol: 105 mg/dL — ABNORMAL HIGH (ref 0–99)
NonHDL: 122.78
Total CHOL/HDL Ratio: 3
Triglycerides: 91 mg/dL (ref 0.0–149.0)
VLDL: 18.2 mg/dL (ref 0.0–40.0)

## 2021-03-04 LAB — TSH: TSH: 1.38 u[IU]/mL (ref 0.35–5.50)

## 2021-03-04 NOTE — Patient Instructions (Signed)

## 2021-03-04 NOTE — Progress Notes (Signed)
Office Note 03/04/2021  CC:  Chief Complaint  Patient presents with   Annual Exam    Pt is fasting    HPI:  Patient is a 40 y.o. female who is here for annual health maintenance exam.  Cassadee is feeling very well.  She recently establish care with an online bariatric provider and was started on Wellbutrin and has a Engineer, maintenance.  She feels like this is helped her a lot and it is also helped with mood and anxiety levels.  She has received appropriate follow-up with her endocrinologist and gynecologist. Recent follow-up thyroid ultrasound reassuring.  Past Medical History:  Diagnosis Date   Abnormal mammogram of both breasts 06/2019   ?asymmetry->diagnostic mammos ok->rpt 06/2020   Capsulitis of foot 2018   L forefoot (Dr. Paulla Dolly).  Orthotics.   Chest pain    stress echo normal 03/2018   Iron deficiency anemia    Hx of; has heavy menses  (low iron/ferritin 06/2013)   Low back pain    L3-L4 ESI 06/03/20 very helpful (Dr Nelva Bush)   Menorrhagia    Dr. Gaetano Net   Tachycardia 05/07/2018   Elevated HR noted on her wrist pulse monitor.  Sinus tach on Holter    Thyroid nodule    Has been euthyroid, but Dr. Chalmers Cater has had her on levothyroxine suppression.    Past Surgical History:  Procedure Laterality Date   BREAST BIOPSY Right 07/08/2014   Fibroadenoma   BUNIONECTOMY Left    CESAREAN SECTION     exercis stress echo  04/17/2018   NORMAL   HOLTER (24h)  05/07/2018   sinus tachy   LAPAROSCOPIC ASSISTED VAGINAL HYSTERECTOMY  2016   with bilat salpingectomy: ovaries still in.  (done for DUB).   LASIK  2008 approx    Family History  Problem Relation Age of Onset   Alzheimer's disease Paternal Grandmother    Heart attack Paternal Grandfather     Social History   Socioeconomic History   Marital status: Married    Spouse name: Not on file   Number of children: Not on file   Years of education: Not on file   Highest education level: Not on file  Occupational History   Not  on file  Tobacco Use   Smoking status: Former    Types: Cigarettes    Quit date: 06/14/2010    Years since quitting: 10.7   Smokeless tobacco: Never  Vaping Use   Vaping Use: Never used  Substance and Sexual Activity   Alcohol use: Yes    Alcohol/week: 2.0 standard drinks    Types: 2 Cans of beer per week    Comment: twice weekly   Drug use: No   Sexual activity: Not on file  Other Topics Concern   Not on file  Social History Narrative   Married, has 1 son and 1 daughter.   Occupation: stay at home mom.   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   Lives in [place].   Tob: 5 pack-yr hx, quit 2012.   Alc: occasional beer.   No drugs.   Exercise: 3-4 days a week : running and/or aerobics.   Social Determinants of Health   Financial Resource Strain: Not on file  Food Insecurity: Not on file  Transportation Needs: Not on file  Physical Activity: Not on file  Stress: Not on file  Social Connections: Not on file  Intimate Partner Violence: Not on file    Outpatient Medications Prior to  Visit  Medication Sig Dispense Refill   buPROPion (WELLBUTRIN XL) 150 MG 24 hr tablet Take 150 mg by mouth daily.     Calcium-Vitamin D-Vitamin K (CALCIUM SOFT CHEWS PO) Take 1 each by mouth daily.     levothyroxine (SYNTHROID) 100 MCG tablet Take 100 mcg by mouth daily.   12   OVER THE COUNTER MEDICATION at bedtime. Musely spot cream; melasma. Use a pea size amount(1 full pump) to spots nightly.     scopolamine (TRANSDERM-SCOP, 1.5 MG,) 1 MG/3DAYS Place 1 patch (1.5 mg total) onto the skin every 3 (three) days. (Patient not taking: Reported on 03/04/2021) 4 patch 1   meloxicam (MOBIC) 15 MG tablet Take 1 tablet (15 mg total) by mouth daily. (Patient not taking: Reported on 03/04/2021) 30 tablet 3   No facility-administered medications prior to visit.    No Known Allergies  ROS Review of Systems  Constitutional:  Negative for appetite change, chills, fatigue and fever.   HENT:  Negative for congestion, dental problem, ear pain and sore throat.   Eyes:  Negative for discharge, redness and visual disturbance.  Respiratory:  Negative for cough, chest tightness, shortness of breath and wheezing.   Cardiovascular:  Negative for chest pain, palpitations and leg swelling.  Gastrointestinal:  Negative for abdominal pain, blood in stool, diarrhea, nausea and vomiting.  Genitourinary:  Negative for difficulty urinating, dysuria, flank pain, frequency, hematuria and urgency.  Musculoskeletal:  Negative for arthralgias, back pain, joint swelling, myalgias and neck stiffness.  Skin:  Negative for pallor and rash.  Neurological:  Negative for dizziness, speech difficulty, weakness and headaches.  Hematological:  Negative for adenopathy. Does not bruise/bleed easily.  Psychiatric/Behavioral:  Negative for confusion and sleep disturbance. The patient is not nervous/anxious.    PE; Vitals with BMI 03/04/2021 03/04/2020 05/06/2019  Height 5' 2.75" 5' 2.75" 5\' 3"   Weight 159 lbs 170 lbs 171 lbs 10 oz  BMI 28.39 30.35 30.41  Systolic 102 121  Diastolic 70 73 76  Pulse 58 86 67   Exam chaperoned by 063, CMA. Gen: Alert, well appearing.  Patient is oriented to person, place, time, and situation. AFFECT: pleasant, lucid thought and speech. ENT: Ears: EACs clear, normal epithelium.  TMs with good light reflex and landmarks bilaterally.  Eyes: no injection, icteris, swelling, or exudate.  EOMI, PERRLA. Nose: no drainage or turbinate edema/swelling.  No injection or focal lesion.  Mouth: lips without lesion/swelling.  Oral mucosa pink and moist.  Dentition intact and without obvious caries or gingival swelling.  Oropharynx without erythema, exudate, or swelling.  Neck: supple/nontender.  No LAD, mass, or TM.  Carotid pulses 2+ bilaterally, without bruits. CV: RRR, no m/r/g.   LUNGS: CTA bilat, nonlabored resps, good aeration in all lung fields. ABD: soft, NT,  ND, BS normal.  No hepatospenomegaly or mass.  No bruits. EXT: no clubbing, cyanosis, or edema.  Musculoskeletal: no joint swelling, erythema, warmth, or tenderness.  ROM of all joints intact. Skin - no sores or suspicious lesions or rashes or color changes  Pertinent labs:  Lab Results  Component Value Date   TSH 0.85 03/04/2020   Lab Results  Component Value Date   WBC 6.7 03/04/2020   HGB 13.3 03/04/2020   HCT 39.9 03/04/2020   MCV 89.0 03/04/2020   PLT 214.0 03/04/2020   Lab Results  Component Value Date   CREATININE 0.80 03/04/2020   BUN 15 03/04/2020   NA 138 03/04/2020   K 4.0 03/04/2020  CL 104 03/04/2020   CO2 26 03/04/2020   Lab Results  Component Value Date   ALT 14 03/04/2020   AST 16 03/04/2020   ALKPHOS 45 03/04/2020   BILITOT 0.6 03/04/2020   Lab Results  Component Value Date   CHOL 180 03/04/2020   Lab Results  Component Value Date   HDL 50.30 03/04/2020   Lab Results  Component Value Date   LDLCALC 91 03/04/2020   Lab Results  Component Value Date   TRIG 190.0 (H) 03/04/2020   Lab Results  Component Value Date   CHOLHDL 4 03/04/2020   ASSESSMENT AND PLAN:   Health maintenance exam: Reviewed age and gender appropriate health maintenance issues (prudent diet, regular exercise, health risks of tobacco and excessive alcohol, use of seatbelts, fire alarms in home, use of sunscreen).  Also reviewed age and gender appropriate health screening as well as vaccine recommendations. Vaccines: flu->pt declined Labs: fasting HP labs today. Cervical ca screening: via Dr. Gaetano Net. Breast ca screening: mammos via GYN, Dr. Gaetano Net. Colon ca screening: average risk patient= as per latest guidelines, start screening at 59 yrs of age.  An After Visit Summary was printed and given to the patient.  FOLLOW UP:  Return in about 1 year (around 03/04/2022) for annual CPE (fasting).  Signed:  Crissie Sickles, MD           03/04/2021

## 2021-06-21 ENCOUNTER — Other Ambulatory Visit: Payer: Self-pay | Admitting: Obstetrics and Gynecology

## 2021-06-21 DIAGNOSIS — Z1231 Encounter for screening mammogram for malignant neoplasm of breast: Secondary | ICD-10-CM

## 2021-07-25 ENCOUNTER — Ambulatory Visit
Admission: RE | Admit: 2021-07-25 | Discharge: 2021-07-25 | Disposition: A | Discharge status: Home / Self Care | Payer: 59 | Source: Ambulatory Visit | Attending: Obstetrics and Gynecology | Admitting: Obstetrics and Gynecology

## 2021-07-25 DIAGNOSIS — Z1231 Encounter for screening mammogram for malignant neoplasm of breast: Secondary | ICD-10-CM

## 2021-07-26 ENCOUNTER — Other Ambulatory Visit: Payer: Self-pay | Admitting: Obstetrics and Gynecology

## 2021-07-26 DIAGNOSIS — R928 Other abnormal and inconclusive findings on diagnostic imaging of breast: Secondary | ICD-10-CM

## 2021-08-08 ENCOUNTER — Ambulatory Visit
Admission: RE | Admit: 2021-08-08 | Discharge: 2021-08-08 | Disposition: A | Discharge status: Home / Self Care | Payer: 59 | Source: Ambulatory Visit | Attending: Obstetrics and Gynecology | Admitting: Obstetrics and Gynecology

## 2021-08-08 ENCOUNTER — Other Ambulatory Visit: Payer: Self-pay | Admitting: Obstetrics and Gynecology

## 2021-08-08 DIAGNOSIS — N6320 Unspecified lump in the left breast, unspecified quadrant: Secondary | ICD-10-CM

## 2021-08-08 DIAGNOSIS — R928 Other abnormal and inconclusive findings on diagnostic imaging of breast: Secondary | ICD-10-CM

## 2021-10-03 ENCOUNTER — Telehealth: Payer: Self-pay

## 2021-10-03 NOTE — Telephone Encounter (Signed)
LM for pt to return call to discuss.  Pt needs appt with PCP due to time from last refill

## 2021-10-03 NOTE — Telephone Encounter (Signed)
Patient refill request.  [facility]  scopolamine (TRANSDERM-SCOP, 1.5 MG,) 1 MG/3DAYS [[id number]]

## 2021-10-10 MED ORDER — SCOPOLAMINE 1 MG/3DAYS TD PT72: 1.0000 | MEDICATED_PATCH | TRANSDERMAL | 1 refills | Status: AC

## 2021-10-10 NOTE — Telephone Encounter (Signed)
Please advise if Rx refill is appropriate 

## 2021-10-10 NOTE — Telephone Encounter (Signed)
Patient returning call about medication.  Please call back when available

## 2021-12-26 ENCOUNTER — Other Ambulatory Visit: Payer: Self-pay | Admitting: Endocrinology

## 2021-12-26 DIAGNOSIS — E049 Nontoxic goiter, unspecified: Secondary | ICD-10-CM

## 2022-01-02 ENCOUNTER — Ambulatory Visit
Admission: RE | Admit: 2022-01-02 | Discharge: 2022-01-02 | Disposition: A | Discharge status: Home / Self Care | Payer: 59 | Source: Ambulatory Visit | Attending: Endocrinology | Admitting: Endocrinology

## 2022-01-02 DIAGNOSIS — E049 Nontoxic goiter, unspecified: Secondary | ICD-10-CM

## 2022-02-08 ENCOUNTER — Ambulatory Visit
Admission: RE | Admit: 2022-02-08 | Discharge: 2022-02-08 | Disposition: A | Discharge status: Home / Self Care | Payer: 59 | Source: Ambulatory Visit | Attending: Obstetrics and Gynecology | Admitting: Obstetrics and Gynecology

## 2022-02-08 ENCOUNTER — Other Ambulatory Visit: Payer: Self-pay | Admitting: Obstetrics and Gynecology

## 2022-02-08 DIAGNOSIS — N6320 Unspecified lump in the left breast, unspecified quadrant: Secondary | ICD-10-CM

## 2022-03-03 NOTE — Patient Instructions (Signed)

## 2022-03-06 ENCOUNTER — Encounter: Payer: Self-pay | Admitting: Family Medicine

## 2022-03-06 ENCOUNTER — Ambulatory Visit (INDEPENDENT_AMBULATORY_CARE_PROVIDER_SITE_OTHER): Payer: 59 | Admitting: Family Medicine

## 2022-03-06 VITALS — BP 116/81 | Temp 98.4°F | Ht 63.0 in | Wt 151.6 lb

## 2022-03-06 DIAGNOSIS — F411 Generalized anxiety disorder: Secondary | ICD-10-CM | POA: Diagnosis not present

## 2022-03-06 DIAGNOSIS — Z0000 Encounter for general adult medical examination without abnormal findings: Secondary | ICD-10-CM

## 2022-03-06 LAB — COMPREHENSIVE METABOLIC PANEL
ALT: 15 U/L (ref 0–35)
AST: 14 U/L (ref 0–37)
Albumin: 4.2 g/dL (ref 3.5–5.2)
Alkaline Phosphatase: 39 U/L (ref 39–117)
BUN: 14 mg/dL (ref 6–23)
CO2: 26 mEq/L (ref 19–32)
Calcium: 9.1 mg/dL (ref 8.4–10.5)
Chloride: 106 mEq/L (ref 96–112)
Creatinine, Ser: 0.92 mg/dL (ref 0.40–1.20)
GFR: 76.63 mL/min (ref >60.00)
Glucose, Bld: 98 mg/dL (ref 70–99)
Potassium: 4.6 mEq/L (ref 3.5–5.1)
Sodium: 140 mEq/L (ref 135–145)
Total Bilirubin: 0.8 mg/dL (ref 0.2–1.2)
Total Protein: 6.4 g/dL (ref 6.0–8.3)

## 2022-03-06 LAB — LIPID PANEL
Cholesterol: 178 mg/dL (ref 0–200)
HDL: 57.3 mg/dL (ref >39.00)
LDL Cholesterol: 104 mg/dL — ABNORMAL HIGH (ref 0–99)
NonHDL: 120.32
Total CHOL/HDL Ratio: 3
Triglycerides: 83 mg/dL (ref 0.0–149.0)
VLDL: 16.6 mg/dL (ref 0.0–40.0)

## 2022-03-06 LAB — CBC
HCT: 40.1 % (ref 36.0–46.0)
Hemoglobin: 13.5 g/dL (ref 12.0–15.0)
MCHC: 33.7 g/dL (ref 30.0–36.0)
MCV: 89.9 fl (ref 78.0–100.0)
Platelets: 206 10*3/uL (ref 150.0–400.0)
RBC: 4.46 Mil/uL (ref 3.87–5.11)
RDW: 12.3 % (ref 11.5–15.5)
WBC: 5.2 10*3/uL (ref 4.0–10.5)

## 2022-03-06 LAB — TSH: TSH: 1.42 u[IU]/mL (ref 0.35–5.50)

## 2022-03-06 NOTE — Progress Notes (Signed)
Office Note 03/06/2022  CC:  Chief Complaint  Patient presents with   Annual Exam    Physical exam    HPI:  Patient is a 41 y.o. female who is here for annual health maintenance exam. Sydney Ramsey is feeling well.  Purposeful weight loss has gone well. She continues to take Wellbutrin, prescribed by an online weight management provider.  She has been on it about a year and asks if I will assume responsibility for prescribing it now. It has helped significantly with her mood and anxiety levels.  Continues to be followed by Dr. Soyla Murphy in endocrinology for her hypothyroidism.  Past Medical History:  Diagnosis Date   Abnormal mammogram of both breasts 06/2019   ?asymmetry->diagnostic mammos ok->rpt 06/2020   Capsulitis of foot 2018   L forefoot (Dr. Paulla Dolly).  Orthotics.   Chest pain    stress echo normal 03/2018   Iron deficiency anemia    Hx of; has heavy menses  (low iron/ferritin 06/2013)   Low back pain    L3-L4 ESI 06/03/20 very helpful (Dr Nelva Bush)   Menorrhagia    Dr. Gaetano Net   Tachycardia 05/07/2018   Elevated HR noted on her wrist pulse monitor.  Sinus tach on Holter    Thyroid nodule    Has been euthyroid, but Dr. Chalmers Cater has had her on levothyroxine suppression.    Past Surgical History:  Procedure Laterality Date   BREAST BIOPSY Right 07/08/2014   Fibroadenoma   BUNIONECTOMY Left    CESAREAN SECTION     exercis stress echo  04/17/2018   NORMAL   HOLTER (24h)  05/07/2018   sinus tachy   LAPAROSCOPIC ASSISTED VAGINAL HYSTERECTOMY  2016   with bilat salpingectomy: ovaries still in.  (done for DUB).   LASIK  2008 approx    Family History  Problem Relation Age of Onset   Alzheimer's disease Paternal Grandmother    Heart attack Paternal Grandfather    Breast cancer Neg Hx     Social History   Socioeconomic History   Marital status: Married    Spouse name: Not on file   Number of children: Not on file   Years of education: Not on file   Highest education level:  Not on file  Occupational History   Not on file  Tobacco Use   Smoking status: Former    Types: Cigarettes    Quit date: 06/14/2010    Years since quitting: 11.7   Smokeless tobacco: Never  Vaping Use   Vaping Use: Never used  Substance and Sexual Activity   Alcohol use: Yes    Alcohol/week: 2.0 standard drinks of alcohol    Types: 2 Cans of beer per week    Comment: twice weekly   Drug use: No   Sexual activity: Not on file  Other Topics Concern   Not on file  Social History Narrative   Married, has 1 son and 1 daughter.   Occupation: stay at home mom.   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   Lives in [place].   Tob: 5 pack-yr hx, quit 2012.   Alc: occasional beer.   No drugs.   Exercise: 3-4 days a week : running and/or aerobics.   Social Determinants of Health   Financial Resource Strain: Not on file  Food Insecurity: Not on file  Transportation Needs: Not on file  Physical Activity: Not on file  Stress: Not on file  Social Connections: Not on file  Intimate  Partner Violence: Not on file    Outpatient Medications Prior to Visit  Medication Sig Dispense Refill   buPROPion (WELLBUTRIN XL) 150 MG 24 hr tablet Take 150 mg by mouth daily.     Calcium-Vitamin D-Vitamin K (CALCIUM SOFT CHEWS PO) Take 1 each by mouth daily.     levothyroxine (SYNTHROID) 100 MCG tablet Take 100 mcg by mouth daily.   12   scopolamine (TRANSDERM-SCOP, 1.5 MG,) 1 MG/3DAYS Place 1 patch (1.5 mg total) onto the skin every 3 (three) days. 4 patch 1   OVER THE COUNTER MEDICATION at bedtime. Musely spot cream; melasma. Use a pea size amount(1 full pump) to spots nightly.     No facility-administered medications prior to visit.    No Known Allergies  ROS Review of Systems  Constitutional:  Negative for appetite change, chills, fatigue and fever.  HENT:  Negative for congestion, dental problem, ear pain and sore throat.   Eyes:  Negative for discharge, redness and  visual disturbance.  Respiratory:  Negative for cough, chest tightness, shortness of breath and wheezing.   Cardiovascular:  Negative for chest pain, palpitations and leg swelling.  Gastrointestinal:  Negative for abdominal pain, blood in stool, diarrhea, nausea and vomiting.  Genitourinary:  Negative for difficulty urinating, dysuria, flank pain, frequency, hematuria and urgency.  Musculoskeletal:  Negative for arthralgias, back pain, joint swelling, myalgias and neck stiffness.  Skin:  Negative for pallor and rash.  Neurological:  Negative for dizziness, speech difficulty, weakness and headaches.  Hematological:  Negative for adenopathy. Does not bruise/bleed easily.  Psychiatric/Behavioral:  Negative for confusion and sleep disturbance. The patient is not nervous/anxious.     PE;    03/06/2022    8:07 AM 03/04/2021    9:07 AM 03/04/2020    2:01 PM  Vitals with BMI  Height 5\' 3"  5' 2.75" 5' 2.75"  Weight 151 lbs 10 oz 159 lbs 170 lbs  BMI 26.86 28.39 30.35  Systolic 116 102  Diastolic 81 70 73  Pulse  58 86     Exam chaperoned by 509, CMA Gen: Alert, well appearing.  Patient is oriented to person, place, time, and situation. AFFECT: pleasant, lucid thought and speech. ENT: Ears: EACs clear, normal epithelium.  TMs with good light reflex and landmarks bilaterally.  Eyes: no injection, icteris, swelling, or exudate.  EOMI, PERRLA. Nose: no drainage or turbinate edema/swelling.  No injection or focal lesion.  Mouth: lips without lesion/swelling.  Oral mucosa pink and moist.  Dentition intact and without obvious caries or gingival swelling.  Oropharynx without erythema, exudate, or swelling.  Neck: supple/nontender.  No LAD, mass, or TM.  Carotid pulses 2+ bilaterally, without bruits. CV: RRR, no m/r/g.   LUNGS: CTA bilat, nonlabored resps, good aeration in all lung fields. ABD: soft, NT, ND, BS normal.  No hepatospenomegaly or mass.  No bruits. EXT: no clubbing,  cyanosis, or edema.  Musculoskeletal: no joint swelling, erythema, warmth, or tenderness.  ROM of all joints intact. Skin - no sores or suspicious lesions or rashes or color changes  Pertinent labs:  Lab Results  Component Value Date   TSH 1.38 03/04/2021   Lab Results  Component Value Date   WBC 5.3 03/04/2021   HGB 13.7 03/04/2021   HCT 40.6 03/04/2021   MCV 88.2 03/04/2021   PLT 213.0 03/04/2021   Lab Results  Component Value Date   CREATININE 0.92 03/04/2021   BUN 12 03/04/2021   NA 139 03/04/2021  K 4.3 03/04/2021   CL 102 03/04/2021   CO2 28 03/04/2021   Lab Results  Component Value Date   ALT 18 03/04/2021   AST 17 03/04/2021   ALKPHOS 48 03/04/2021   BILITOT 0.8 03/04/2021   Lab Results  Component Value Date   CHOL 182 03/04/2021   Lab Results  Component Value Date   HDL 58.80 03/04/2021   Lab Results  Component Value Date   LDLCALC 105 (H) 03/04/2021   Lab Results  Component Value Date   TRIG 91.0 03/04/2021   Lab Results  Component Value Date   CHOLHDL 3 03/04/2021   ASSESSMENT AND PLAN:   Health maintenance exam: Reviewed age and gender appropriate health maintenance issues (prudent diet, regular exercise, health risks of tobacco and excessive alcohol, use of seatbelts, fire alarms in home, use of sunscreen).  Also reviewed age and gender appropriate health screening as well as vaccine recommendations. Vaccines: all UTD Labs: fasting HP today Cervical ca screening: via GYN MD Breast ca screening: due 07/2022 Colon ca screening: average risk patient= as per latest guidelines, start screening at 75 yrs of age.  GAD, weight loss management. Has been seeing online weight management provider but is going to graduate from this and I will assume responsibility for prescribing her Wellbutrin.  An After Visit Summary was printed and given to the patient.  FOLLOW UP:  Return in about 1 year (around 03/07/2023) for annual CPE (fasting).  Signed:   Crissie Sickles, MD           03/06/2022

## 2022-04-07 ENCOUNTER — Other Ambulatory Visit: Payer: Self-pay

## 2022-04-07 MED ORDER — BUPROPION HCL ER (XL) 150 MG PO TB24: 150.0000 mg | ORAL_TABLET | Freq: Every day | ORAL | 1 refills | Status: DC

## 2022-04-07 NOTE — Telephone Encounter (Signed)
RF request for wellbutrin LOV: 03/06/22 Next ov: 03/09/23 Last written: historical provider   Please fill, if appropriate. Med pending

## 2022-04-07 NOTE — Telephone Encounter (Signed)
Patient was seen in December by Dr. Anitra Lauth, she stated she wanted him to take over management.  She was told to call office when she needed refills.  [facility]  Please send new prescription.  Patient can be reached at [phone]  buPROPion (WELLBUTRIN XL) 150 MG 24 hr tablet

## 2022-04-28 ENCOUNTER — Telehealth: Payer: Self-pay

## 2022-04-28 DIAGNOSIS — E041 Nontoxic single thyroid nodule: Secondary | ICD-10-CM

## 2022-04-28 NOTE — Telephone Encounter (Signed)
Ok ordered

## 2022-04-28 NOTE — Telephone Encounter (Signed)
Patient requesting to change Endocrinologist provider. She was seen by Dr. Anitra Lauth in Dec 2023 for her CPE.  She is requesting referral to Dr. Philemon Kingdom

## 2022-04-28 NOTE — Telephone Encounter (Signed)
Please advise, referral pending

## 2022-04-28 NOTE — Telephone Encounter (Signed)
Left detailed message per DPR. MyChart message sent as well

## 2022-05-09 ENCOUNTER — Telehealth: Payer: Self-pay

## 2022-05-09 NOTE — Telephone Encounter (Signed)
Lvm for pt to call back to schedule New Patient appt.

## 2022-05-10 NOTE — Telephone Encounter (Signed)
Pt scheduled  

## 2022-06-05 ENCOUNTER — Ambulatory Visit (INDEPENDENT_AMBULATORY_CARE_PROVIDER_SITE_OTHER): Payer: 59 | Admitting: Internal Medicine

## 2022-06-05 ENCOUNTER — Encounter: Payer: Self-pay | Admitting: Internal Medicine

## 2022-06-05 VITALS — BP 122/80 | HR 71 | Ht 63.0 in | Wt 153.0 lb

## 2022-06-05 DIAGNOSIS — E038 Other specified hypothyroidism: Secondary | ICD-10-CM

## 2022-06-05 DIAGNOSIS — Z8639 Personal history of other endocrine, nutritional and metabolic disease: Secondary | ICD-10-CM

## 2022-06-05 DIAGNOSIS — E063 Autoimmune thyroiditis: Secondary | ICD-10-CM | POA: Diagnosis not present

## 2022-06-05 NOTE — Progress Notes (Unsigned)
Patient ID: Sydney Ramsey, female   DOB: 1980/08/11, 42 y.o.   MRN: [id number]  HPI  Sydney Ramsey is a 42 y.o.-year-old female, referred by her PCP, Dr. Anitra Lauth, for history of a thyroid nodule  and Hashimoto's hypothyroidism.    Pt. mentions that approximately 10 years ago she developed neck swelling and was found to have a small thyroid nodule.  However, around that time, she was also found to have very high thyroid antibodies and she had a low pulse and blood pressure, also alopecia, and feeling poorly.  She was started levothyroxine by Dr. Buddy Duty and then transition to see Dr. Chalmers Cater.   Reviewing the thyroid ultrasound report available in epic, she was reported to have a 7 mm nodule in 2014, but repeated ultrasounds obtained afterwards did not show a nodule: Thyroid U/S (01/02/2022): Parenchymal Echotexture: Mildly heterogenous  Isthmus: 0.1 cm  Right lobe: 4.4 x 1.3 x 1.3 cm  Left lobe: 4.7 x 1.1 x 1.3 cm  _________________________________________________________   Estimated total number of nodules >/= 1 cm: 0 _________________________________________________________   No discrete nodules are seen within the thyroid gland.   IMPRESSION: Mildly heterogeneous thyroid without discrete nodule.  Thyroid U/S (11/29/2020): Parenchymal Echotexture: Moderately heterogenous  Isthmus: 0.2 cm  Right lobe: 4.7 x 1.2 x 1.2 cm  Left lobe: 4.1 x 0.9 x 1.1 cm  Previous dimensions from 11/2017 are similar.  _________________________________________________________   Estimated total number of nodules >/= 1 cm: 0  _________________________________________________________   No discrete nodules are seen within the thyroid gland.   IMPRESSION: Nonenlarged thyroid gland with heterogeneous echotexture. No discrete nodule identified.  Thyroid U/S (09/18/20219): Parenchymal Echotexture: Moderately heterogenous  Isthmus: 2 mm, unchanged  Right lobe: 4.8 x 1.7 x 1.5 cm, previously 0.9 x 1.9 x 1.3  cm  Left lobe: 4.2 x 1.1 x 1.4 cm, previously 3.9 x 1.3 x 1.3 cm  _________________________________________________________   Estimated total number of nodules >/= 1 cm: 0 _________________________________________________________   Similar moderate gland heterogeneity and nodular mixed echogenicity without significant discrete focal nodule or mass. Normal vascularity. No adenopathy.   IMPRESSION: Stable gland heterogeneity without significant interval change. No significant developing thyroid nodule.  Thyroid U/S (09/17/2013): Right thyroid lobe  Measurements: 4.9 x 1.9 x 1.3 cm. Right thyroid tissue is diffusely  heterogeneous and similar to the previous examination. No focal  right thyroid nodule.   Left thyroid lobe  Measurements: 3.9 x 1.3 x 1.3 cm. Left thyroid lobe is diffusely  heterogeneous and similar to the previous examination. There is no  discrete thyroid nodule.   Isthmus  Thickness: 0.2 cm.  No nodules visualized.   Lymphadenopathy   Bilateral submandibular lymph nodes measuring 0.5 cm in the short  axis.   IMPRESSION:  The thyroid tissue is very heterogeneous bilaterally and similar to the previous examination. No focal thyroid nodules.   Thyroid U/S (07/10/2012): Right thyroid lobe:  Measures 5.5 x 1.9 x 1.5 cm and is  heterogeneous in echotexture.  Left thyroid lobe:  Measures 4.0 x 1.1 x 1.7 cm and is  heterogeneous in echotexture.  Isthmus:  Measures 3 mm. Focal nodules:  Possible 7 mm hypoechoic nodule in the right mid pole.   Lymphadenopathy: None visualized.   IMPRESSION:  Very heterogeneous thyroid with a small right thyroid nodule.  Findings do not meet current SRU consensus criteria for biopsy.  Follow-up by clinical exam is recommended.  If patient has known  risk factors for thyroid carcinoma, consider follow-up  ultrasound  in 12 months.  If patient is clinically hyperthyroid, consider  nuclear medicine thyroid uptake and scan.   Pt. is  on levothyroxine 100 mcg daily, taken: - in am - fasting - at least 30 min from b'fast - + calcium at night - no iron - no multivitamins - no PPIs - not on Biotin  I reviewed pt's thyroid tests: Lab Results  Component Value Date   TSH 1.42 03/06/2022   TSH 1.38 03/04/2021   TSH 0.85 03/04/2020   TSH 1.96 07/16/2017   TSH 1.36 07/12/2016   TSH 0.43 07/03/2013   TSH 0.81 06/13/2013   Antithyroid antibodies: No results found for: "THGAB" No components found for: "TPOAB"  Pt describes: - fatigue - weight gain previously, but was able to lose weight lately - cold intolerance at night and heat intolerance during the day - hair loss  But denies: - constipation - dry skin  Pt denies: - feeling nodules in neck - hoarseness - dysphagia - choking  She has + FH of thyroid disorders in: sister - resolved. No FH of thyroid cancer. No h/o radiation tx to head or neck. No recent use of iodine supplements.  No herbal supplements.  No recent steroid use.  Pt. also has a history of iron deficiency anemia (not since TAH), low back pain.  She is very active - plays pickleball, or Marrianne Mood, walks the dog.  ROS: Constitutional: + See HPI, + nocturia Eyes: no blurry vision, no xerophthalmia ENT: no sore throat, + see HPI, no tinnitus Cardiovascular: no CP, no SOB, no palpitations, + leg swelling Respiratory: no cough, no SOB, no wheezing Gastrointestinal: no N, no V, no D, no C, no acid reflux Musculoskeletal: no muscle, no joint aches Skin: no rash, + hair loss, + easy bruising Neurological: no tremors, no numbness or tingling/no dizziness/no HAs Psychiatric: no depression, no anxiety + low libido  Past Medical History:  Diagnosis Date   Abnormal mammogram of both breasts 06/2019   ?asymmetry->diagnostic mammos [place]->rpt 06/2020   Capsulitis of foot 2018   L forefoot (Dr. Paulla Dolly).  Orthotics.   Chest pain    stress echo normal 03/2018   Iron deficiency anemia    Hx of;  has heavy menses  (low iron/ferritin 06/2013)   Low back pain    L3-L4 ESI 06/03/20 very helpful (Dr Nelva Bush)   Menorrhagia    Dr. Gaetano Net   Tachycardia 05/07/2018   Elevated HR noted on her wrist pulse monitor.  Sinus tach on Holter    Thyroid nodule    Has been euthyroid, but Dr. Chalmers Cater has had her on levothyroxine suppression.   Past Surgical History:  Procedure Laterality Date   BREAST BIOPSY Right 07/08/2014   Fibroadenoma   BUNIONECTOMY Left    CESAREAN SECTION     exercis stress echo  04/17/2018   NORMAL   HOLTER (24h)  05/07/2018   sinus tachy   LAPAROSCOPIC ASSISTED VAGINAL HYSTERECTOMY  2016   with bilat salpingectomy: ovaries still in.  (done for DUB).   LASIK  2008 approx   Social History   Socioeconomic History   Marital status: Married    Spouse name: Not on file   Number of children: 2   Years of education: Not on file   Highest education level: Not on file  Occupational History   Occupation: SAHM  Tobacco Use   Smoking status: Former    Types: Cigarettes    Quit date: 2009  Years since quitting: 15.2   Smokeless tobacco: Never  Vaping Use   Vaping Use: Never used  Substance and Sexual Activity   Alcohol use: Not Currently    Comment: 2 beers or glasses of wine 2-3x weekly   Drug use: No   Sexual activity: Not on file  Other Topics Concern   Not on file  Social History Narrative   Married, has 1 son and 1 daughter.   Occupation: stay at home mom.   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   Lives in [place].   Tob: 5 pack-yr hx, quit 20009.   Alc: occasional beer.   No drugs.   Exercise: 3-4 days a week : running and/or aerobics.   Social Determinants of Health   Financial Resource Strain: Not on file  Food Insecurity: Not on file  Transportation Needs: Not on file  Physical Activity: Not on file  Stress: Not on file  Social Connections: Not on file  Intimate Partner Violence: Not on file   Current Outpatient  Medications on File Prior to Visit  Medication Sig Dispense Refill   buPROPion (WELLBUTRIN XL) 150 MG 24 hr tablet Take 1 tablet (150 mg total) by mouth daily. 90 tablet 1   Calcium-Vitamin D-Vitamin K (CALCIUM SOFT CHEWS PO) Take 1 each by mouth daily.     levothyroxine (SYNTHROID) 100 MCG tablet Take 100 mcg by mouth daily.   12   scopolamine (TRANSDERM-SCOP, 1.5 MG,) 1 MG/3DAYS Place 1 patch (1.5 mg total) onto the skin every 3 (three) days. 4 patch 1   No current facility-administered medications on file prior to visit.   No Known Allergies Family History  Problem Relation Age of Onset   Alzheimer's disease Paternal Grandmother    Heart attack Paternal Grandfather    Breast cancer Neg Hx    PE: BP 122/80 (BP Location: Left Arm, Patient Position: Sitting, Cuff Size: Normal)   Pulse 71   Ht 5\' 3"  (1.6 m)   Wt 153 lb (69.4 kg)   LMP 06/17/2013   SpO2 97%   BMI 27.10 kg/m  Wt Readings from Last 3 Encounters:  06/05/22 153 lb (69.4 kg)  03/06/22 151 lb 9.6 oz (68.8 kg)  03/04/21 159 lb (72.1 kg)   Constitutional: normal weight, in NAD Eyes:  EOMI, no exophthalmos ENT: no neck masses, + fullness in bilateral neck, no cervical lymphadenopathy Cardiovascular: RRR, No MRG Respiratory: CTA B Musculoskeletal: no deformities Skin:no rashes Neurological: no tremor with outstretched hands  ASSESSMENT: 1.  Thyroid nodule  2.  Hashimoto's Hypothyroidism  PLAN:  1.  Thyroid nodule - Small 7 mm thyroid nodule observed 10 years ago on the thyroid ultrasound, with subsequent repeated thyroid ultrasounds that do not show nodules only heterogeneity.  - The heterogeneity is indicative of history of thyroiditis, possibly Hashimoto's thyroiditis  - However, after the ultrasound from 2015 which showed disappearance of the nodule, further imaging is very low yield - The previously seen nodule could have been related to inflammation (pseudonodule) - No ultrasounds are further  recommended  2. Patient with long-standing hypothyroidism, on levothyroxine therapy - we discussed about  Hashimoto thyroiditis  as an autoimmune disorder, in which the immune system attacks her own thyroid. The antibodies bind to the thyroid tissue and cause inflammation, and, eventually, destruction of the gland and hypothyroidism.  - I explained that thyroid enlargement  is not uncommon, and it has a waxing and waning character. She can take Ibuprofen if  this is bothersome.  - We discussed about treatment for Hashimoto thyroiditis, which is actually limited to thyroid hormones in case the TFTs are abnormal. Supplements like selenium has been tried with various results, some showing improvement in the TPO antibodies. However, there are no randomized controlled trials of this are consistent results between trials. She agrees to try Selenium 200 mcg daily - We also discussed about ways to improve her immune system (relaxation, diet, exercise, sleep) to reduce the Ab titer and, subsequently, the thyroid inflammation. - latest thyroid labs reviewed with pt. >> normal: Lab Results  Component Value Date   TSH 1.42 03/06/2022  - she continues on LT4 100 mcg daily - we discussed about the possibility of using desiccated thyroid extract.  However, for now, plan to continue LT4. - we discussed about taking the thyroid hormone every day, with water, >30 minutes before breakfast, separated by >4 hours from acid reflux medications, calcium, iron, multivitamins. Pt. is taking it correctly. - will check thyroid tests today: TSH, free T3, and fT4 and also her Abs levels - If labs are abnormal, she will need to return for repeat TFTs in 1.5 months - Otherwise, I will see her back in 6 months  Needs refills.  Philemon Kingdom, MD PhD [facility]

## 2022-06-05 NOTE — Patient Instructions (Addendum)
Please continue Levothyroxine 100 mcg daily.  Take the thyroid hormone every day, with water, at least 30 minutes before breakfast, separated by at least 4 hours from: - acid reflux medications - calcium - iron - multivitamins  Try Selenium 200 mcg daily/4 [place] nuts a day.  Please stop at the lab.  You should have an endocrinology follow-up appointment in 6 months.

## 2022-06-06 LAB — T4, FREE: Free T4: 1.04 ng/dL (ref 0.60–1.60)

## 2022-06-06 LAB — T3, FREE: T3, Free: 2.4 pg/mL (ref 2.3–4.2)

## 2022-06-06 LAB — THYROID PEROXIDASE ANTIBODY: Thyroperoxidase Ab SerPl-aCnc: 56 IU/mL — ABNORMAL HIGH (ref <9)

## 2022-06-06 LAB — THYROGLOBULIN ANTIBODY: Thyroglobulin Ab: 1 IU/mL (ref <=1)

## 2022-06-06 LAB — TSH: TSH: 0.72 u[IU]/mL (ref 0.35–5.50)

## 2022-06-07 MED ORDER — LEVOTHYROXINE SODIUM 100 MCG PO TABS: 100.0000 ug | ORAL_TABLET | Freq: Every day | ORAL | 3 refills | Status: DC

## 2022-08-10 ENCOUNTER — Ambulatory Visit
Admission: RE | Admit: 2022-08-10 | Discharge: 2022-08-10 | Disposition: A | Discharge status: Home / Self Care | Payer: 59 | Source: Ambulatory Visit | Attending: Obstetrics and Gynecology | Admitting: Obstetrics and Gynecology

## 2022-08-10 DIAGNOSIS — N6320 Unspecified lump in the left breast, unspecified quadrant: Secondary | ICD-10-CM

## 2022-09-30 ENCOUNTER — Other Ambulatory Visit: Payer: Self-pay | Admitting: Family Medicine

## 2022-11-14 ENCOUNTER — Other Ambulatory Visit: Payer: Self-pay | Admitting: Family Medicine

## 2022-11-27 IMAGING — MG MM DIGITAL DIAGNOSTIC UNILAT*L* W/ TOMO W/ CAD
6 series · 6 of 18 positions shown · non-contrast
Comparison: Previous exam(s).

CLINICAL DATA: 41-year-old female presenting as a recall from
screening for possible left breast masses.

EXAM:
DIGITAL DIAGNOSTIC UNILATERAL LEFT MAMMOGRAM WITH TOMOSYNTHESIS AND
CAD; ULTRASOUND LEFT BREAST LIMITED
TECHNIQUE: Left digital diagnostic mammography and breast tomosynthesis was
performed. The images were evaluated with computer-aided detection.;
Targeted ultrasound examination of the left breast was performed.

[L CC synth-2D]
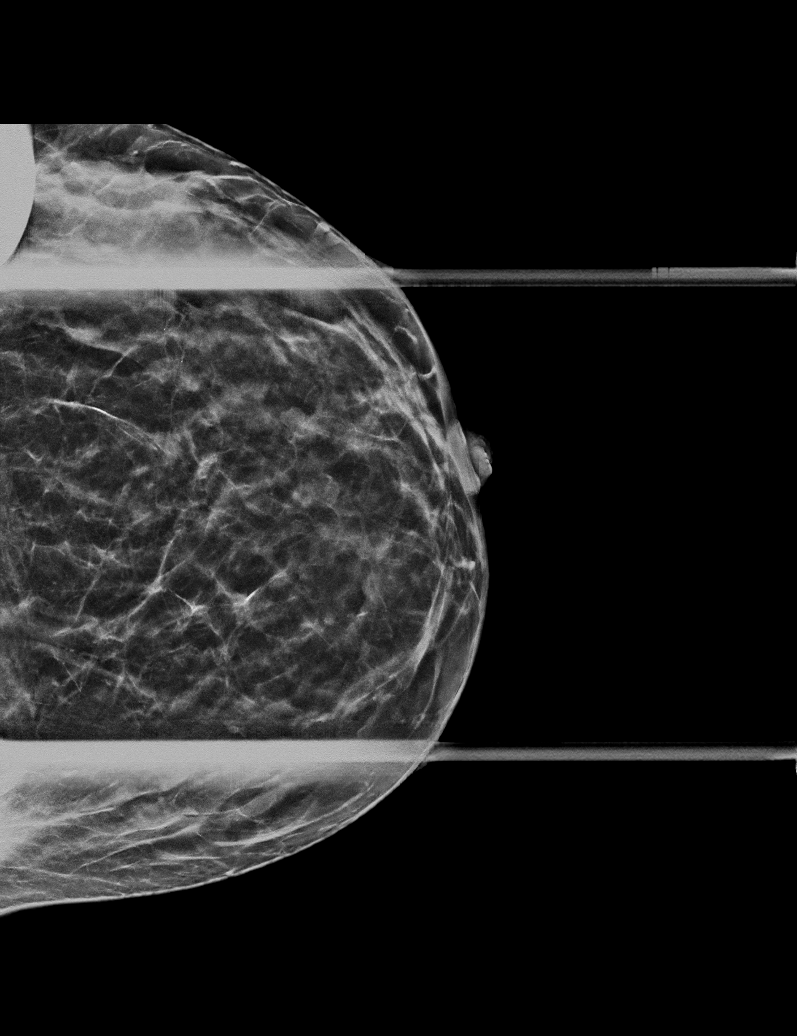

[L ML synth-2D]
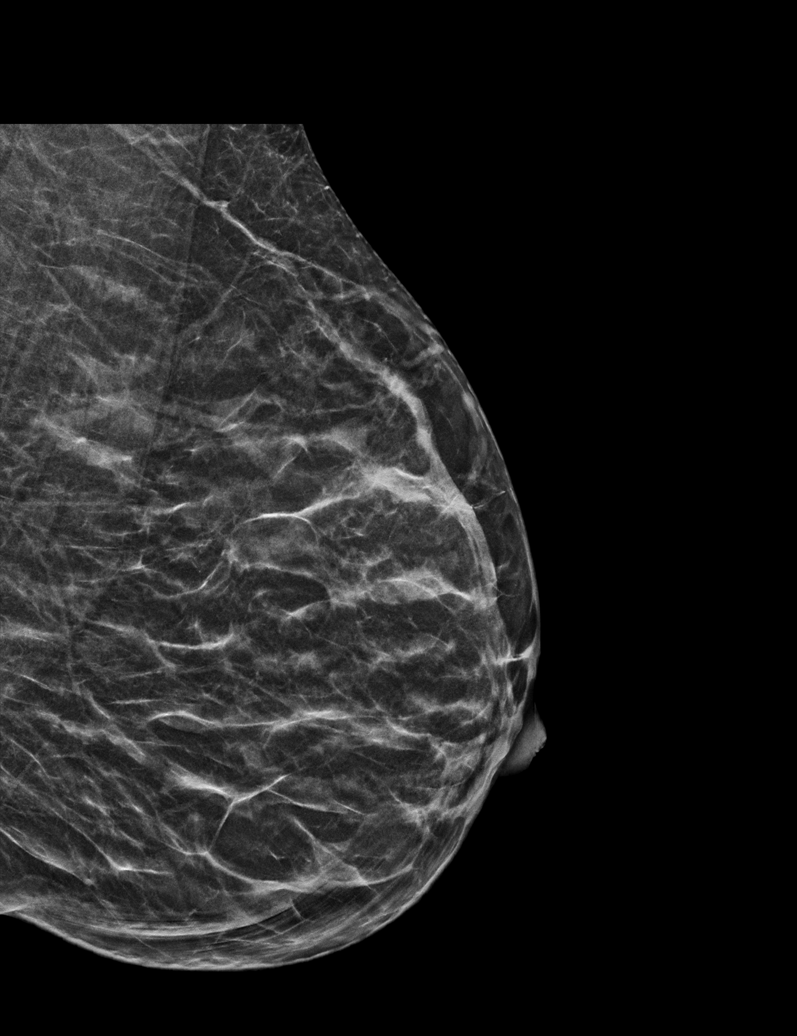

[L MLO synth-2D]
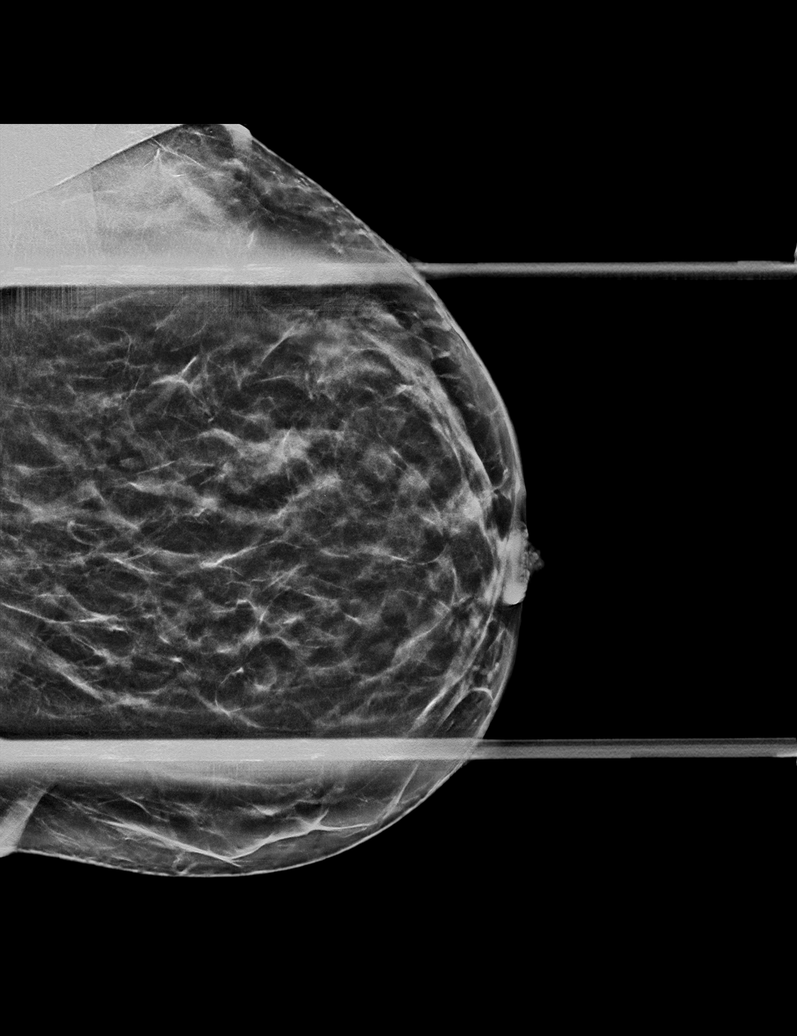

[L ML tomo · tomo slice 21/41.0]
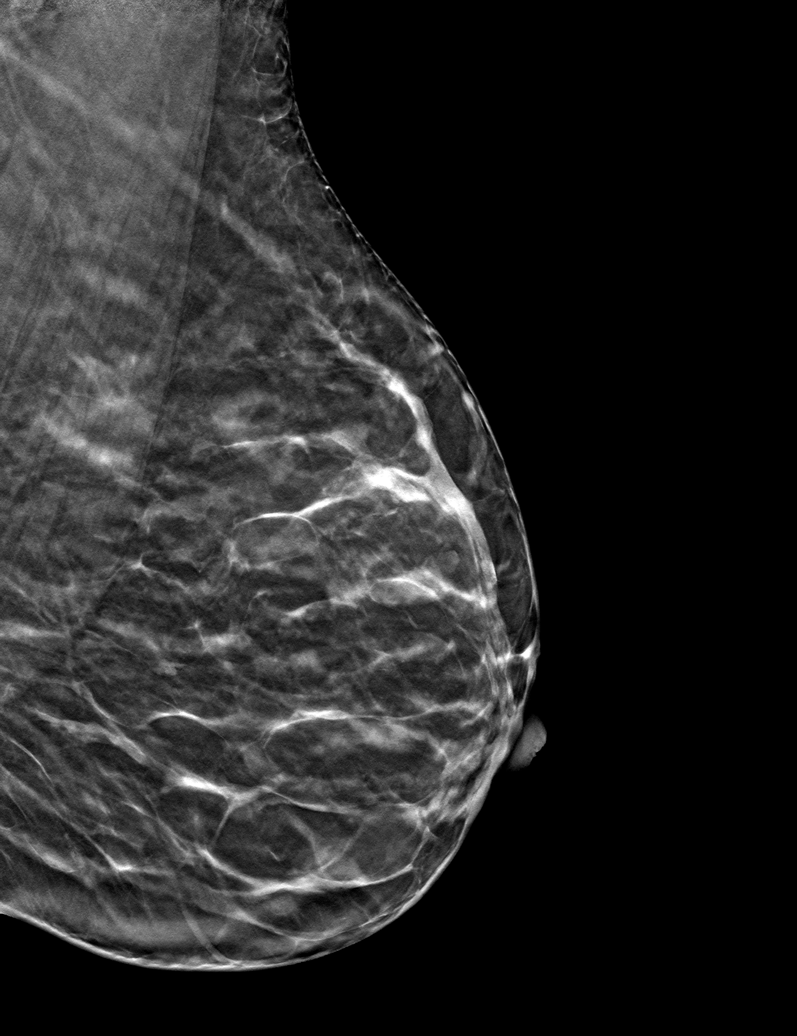

[L MLO tomo · tomo slice 25/48.0]
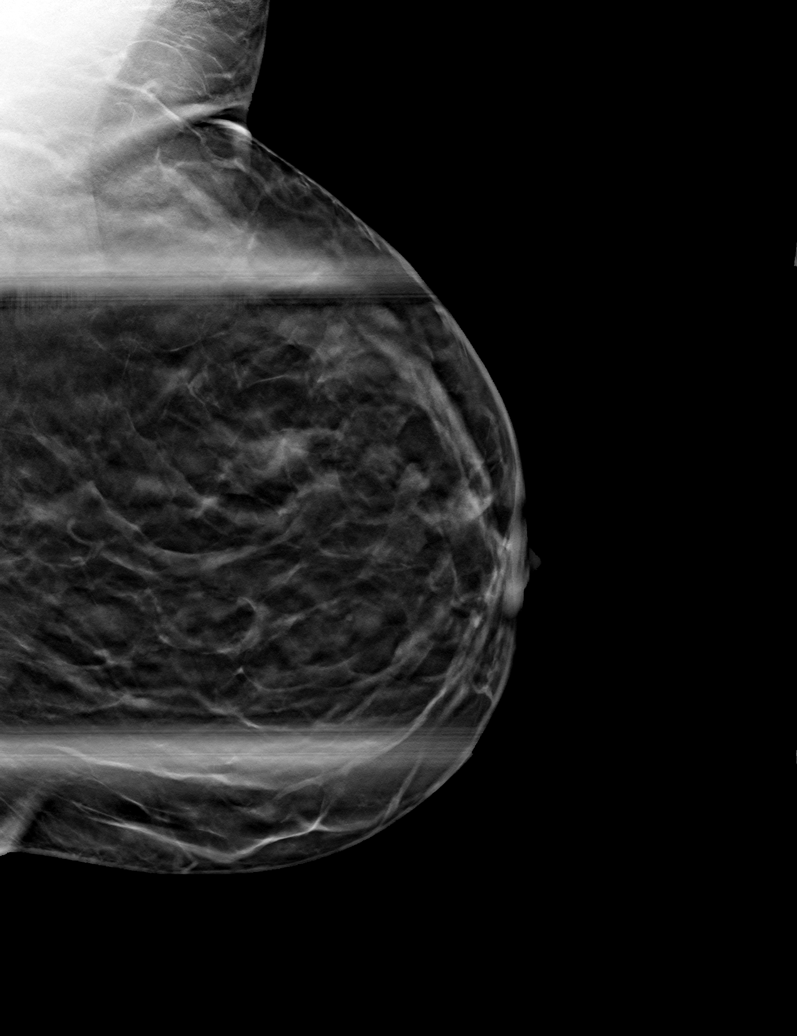

[L CC tomo · tomo slice 23/44.0]
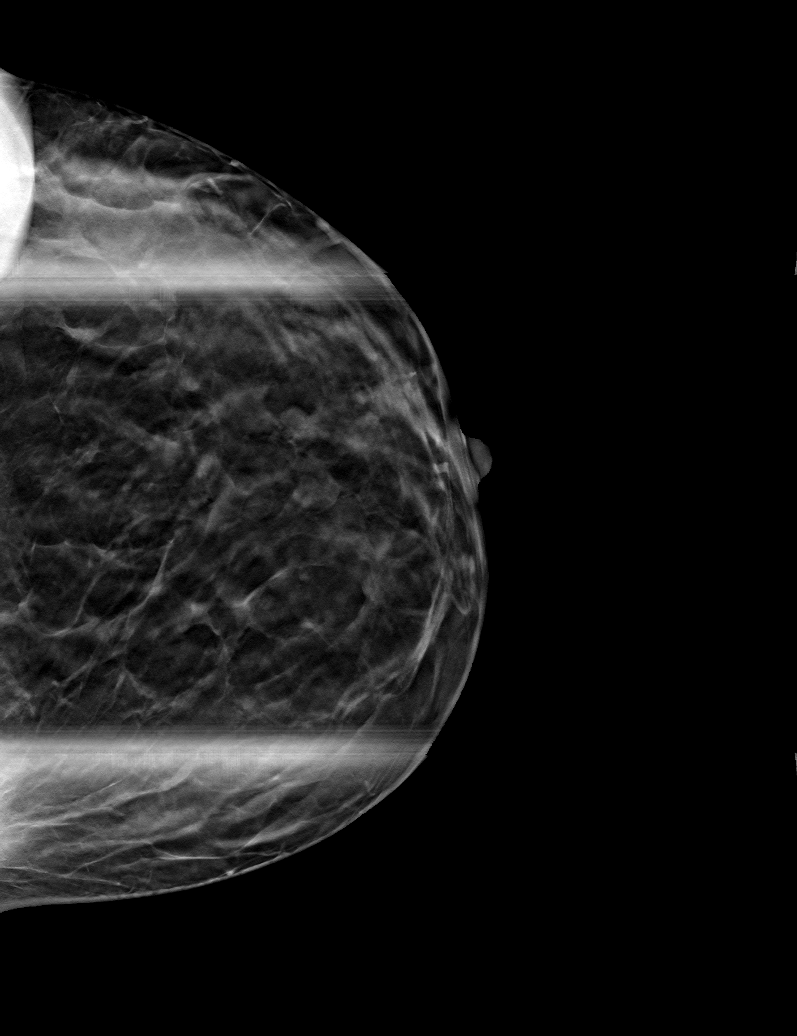

[6 of 18 positions shown; findings below may reference images not displayed]

ACR Breast Density Category c: The breast tissue is heterogeneously
dense, which may obscure small masses.
FINDINGS: Mammogram:

Left breast: Spot compression tomosynthesis views as well as full
field true lateral tomosynthesis views of the left breast were
performed demonstrating persistence of a subcentimeter obscured mass
in the slightly upper central left breast as well as an oval
circumscribed mass in the lower central left breast measuring
approximately 0.7 cm.

Ultrasound:

Targeted ultrasound is performed in the left breast at 12 o'clock 3
cm from the nipple demonstrating a cluster of cysts measuring 0.8 x
0.3 x 0.7 cm. No internal vascularity. No suspicious solid component
identified. This corresponds to one of the masses seen
mammographically.

At 6 o'clock 3 cm from the nipple there is an oval circumscribed
hypoechoic mass measuring 0.7 x 0.3 x 0.5 cm, likely a complicated
cyst. This corresponds to the second mass seen mammographically.
IMPRESSION: Probably benign masses in the left breast at 12 o'clock and 6
o'clock likely representing a cluster of cysts and a complicated
cyst.

RECOMMENDATION:
Left breast ultrasound in 6 months.

I have discussed the findings and recommendations with the patient
who agrees to short-term follow-up. If applicable, a reminder letter
will be sent to the patient regarding the next appointment.

BI-RADS CATEGORY  3: Probably benign.

## 2022-11-27 IMAGING — US US BREAST*L* LIMITED INC AXILLA
1 series · 13 of 14 positions shown · non-contrast
Comparison: Previous exam(s).

CLINICAL DATA: 41-year-old female presenting as a recall from
screening for possible left breast masses.

EXAM:
DIGITAL DIAGNOSTIC UNILATERAL LEFT MAMMOGRAM WITH TOMOSYNTHESIS AND
CAD; ULTRASOUND LEFT BREAST LIMITED
TECHNIQUE: Left digital diagnostic mammography and breast tomosynthesis was
performed. The images were evaluated with computer-aided detection.;
Targeted ultrasound examination of the left breast was performed.

[Series 1: us breast*left* limited inc axilla · 0.05mm/px · 13 of 14 slices shown]
[im 1/14]
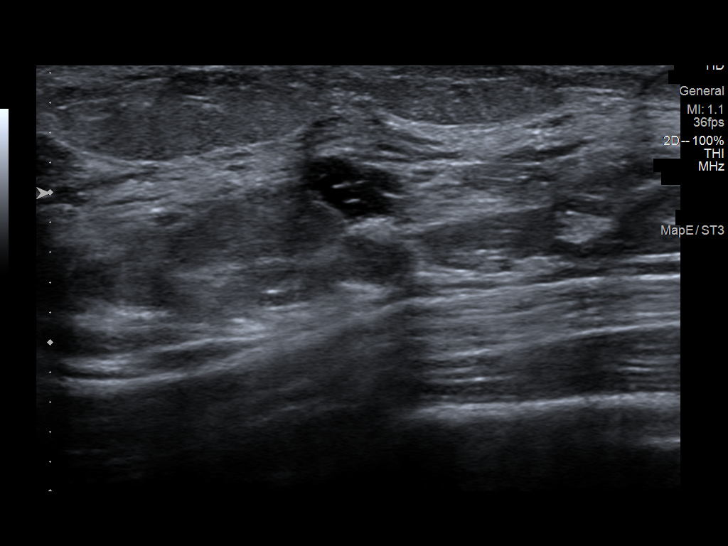
[im 2/14]
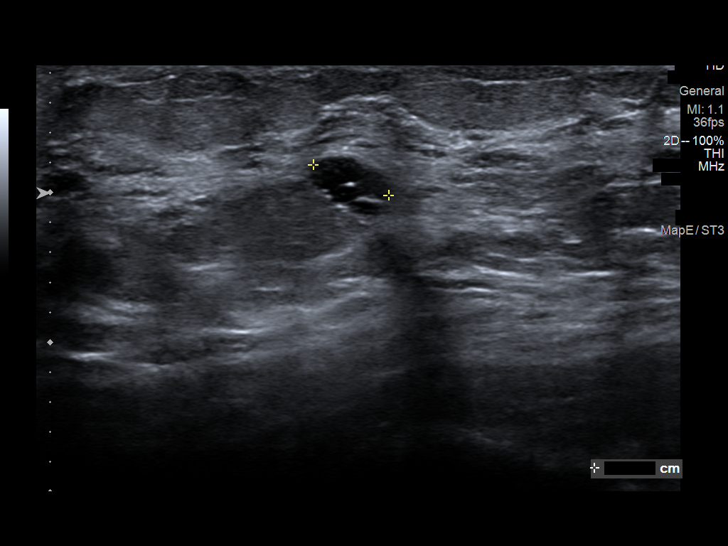
[im 3/14]
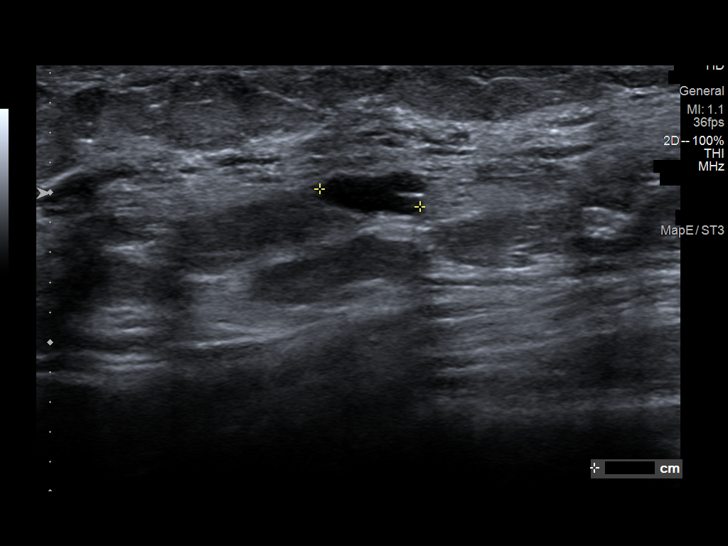
[im 4/14]
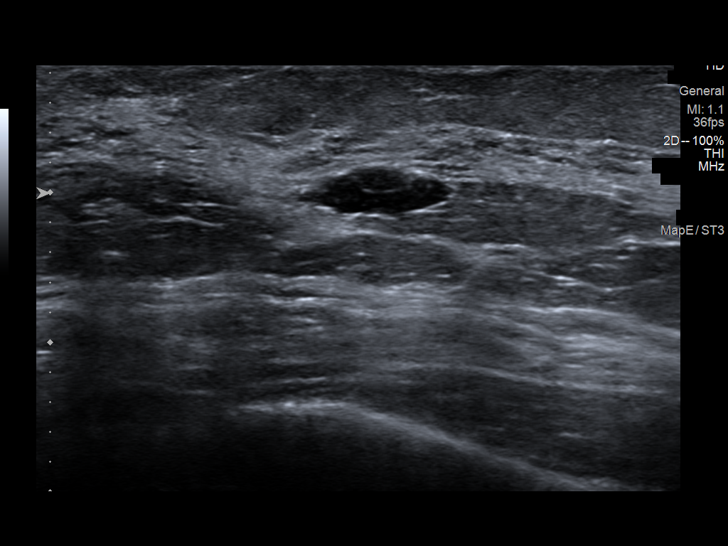
[im 5/14]
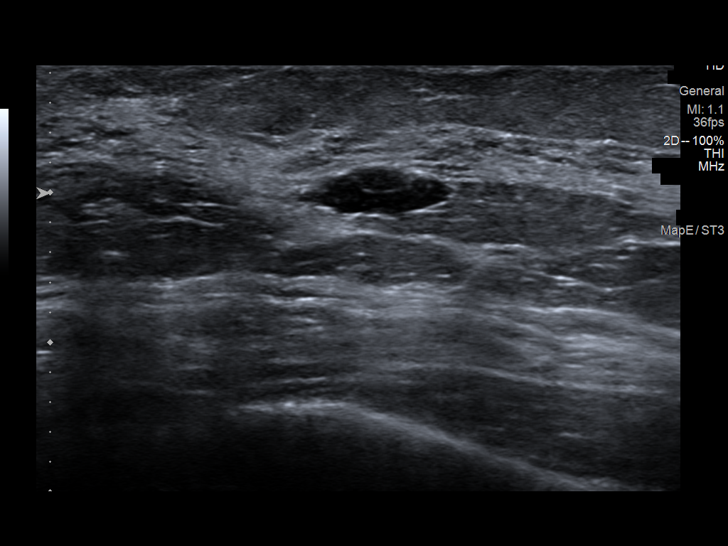
[im 6/14]
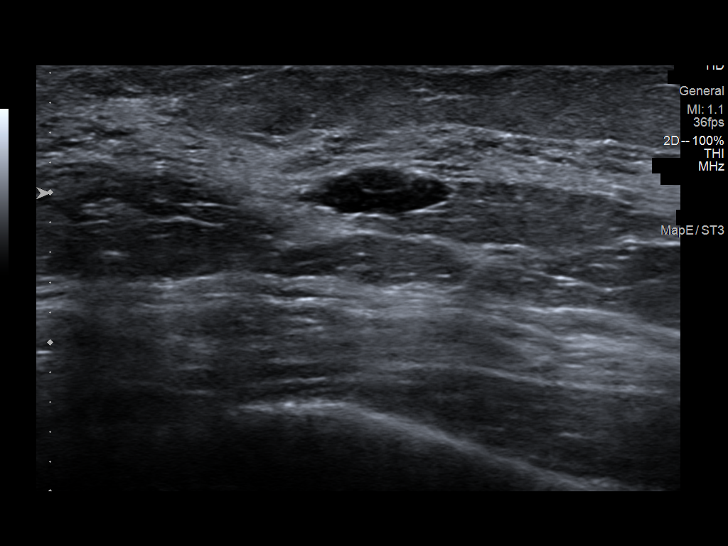
[im 8/14]
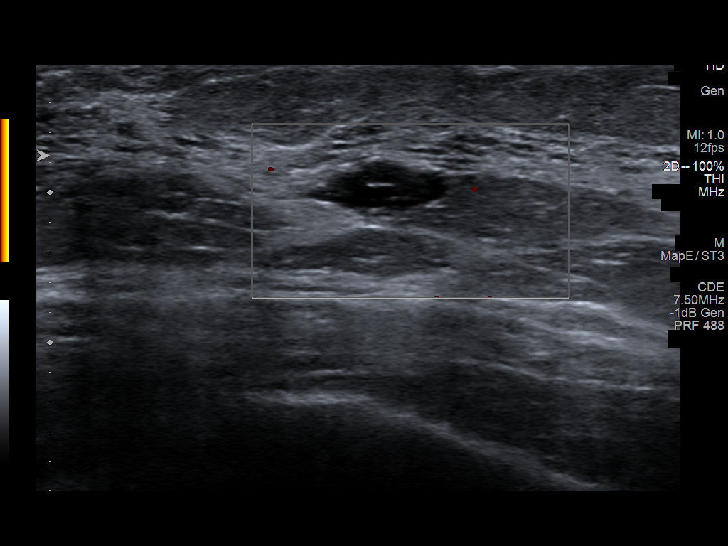
[im 9/14]
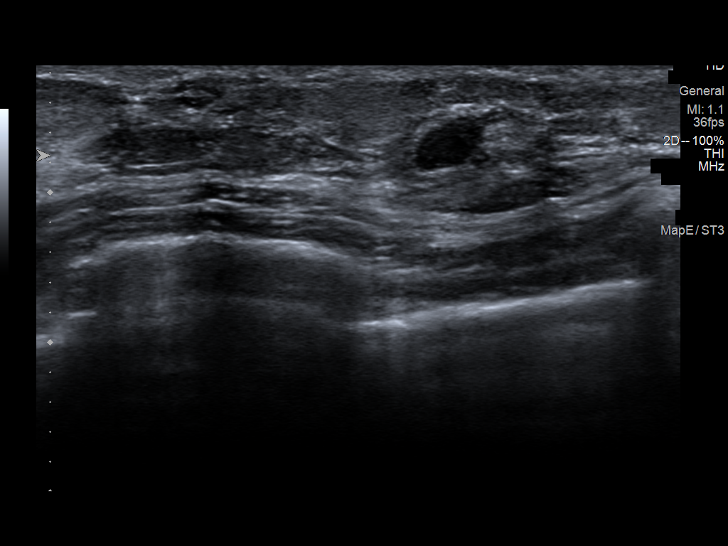
[im 10/14]
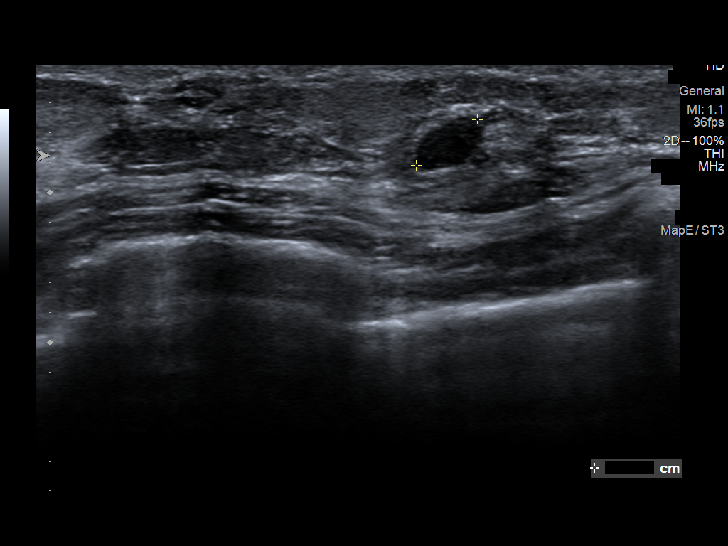
[im 11/14]
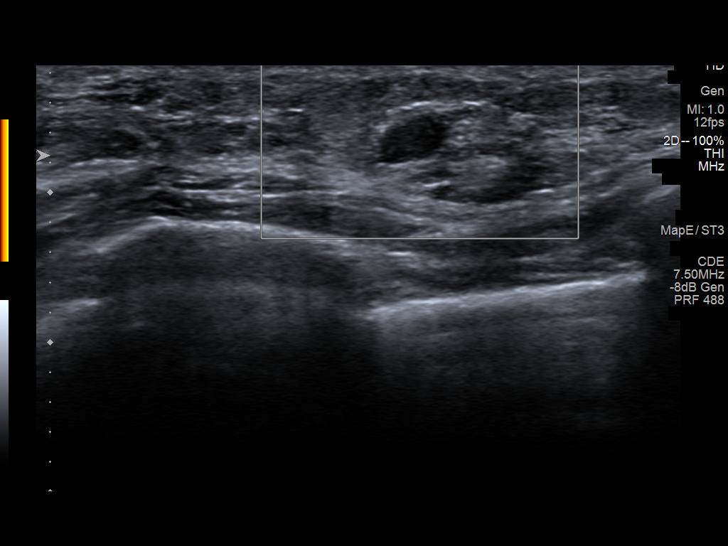
[im 12/14]
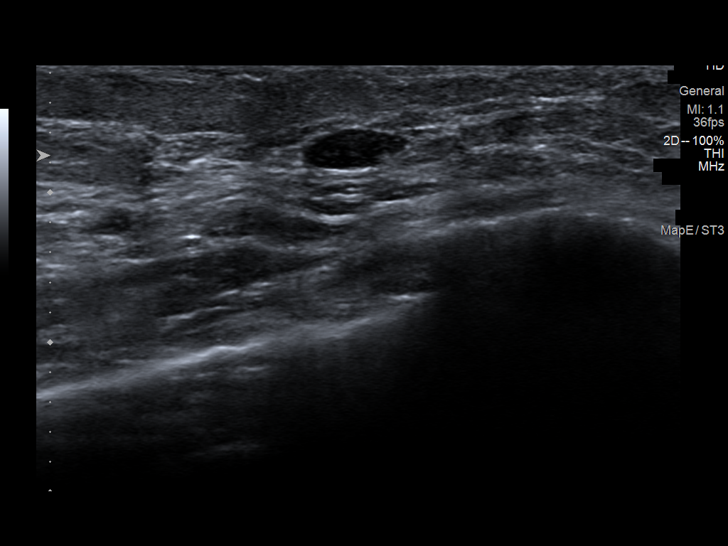
[im 13/14]
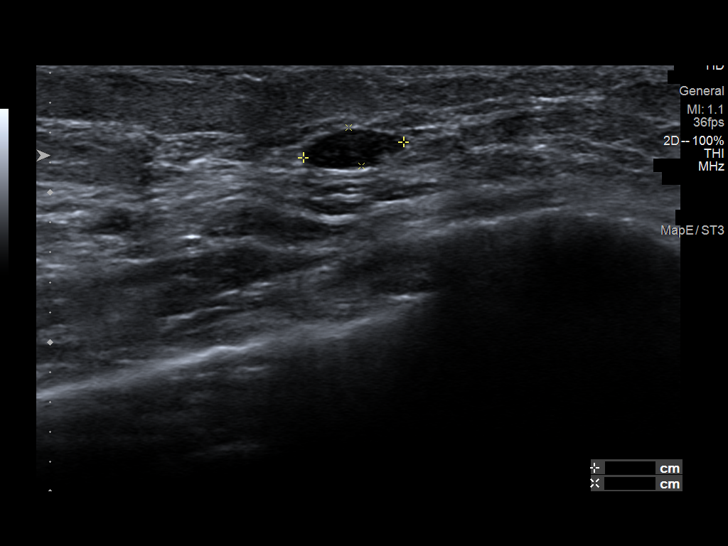
[im 14/14]
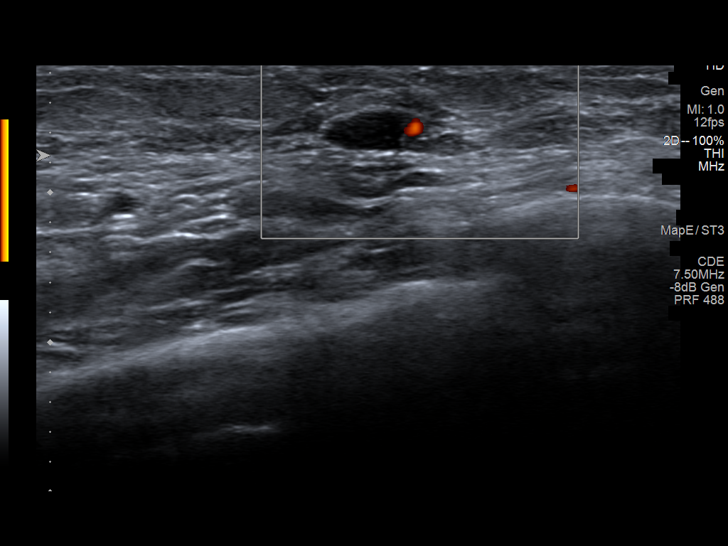

[13 of 14 positions shown; findings below may reference images not displayed]

ACR Breast Density Category c: The breast tissue is heterogeneously
dense, which may obscure small masses.
FINDINGS: Mammogram:

Left breast: Spot compression tomosynthesis views as well as full
field true lateral tomosynthesis views of the left breast were
performed demonstrating persistence of a subcentimeter obscured mass
in the slightly upper central left breast as well as an oval
circumscribed mass in the lower central left breast measuring
approximately 0.7 cm.

Ultrasound:

Targeted ultrasound is performed in the left breast at 12 o'clock 3
cm from the nipple demonstrating a cluster of cysts measuring 0.8 x
0.3 x 0.7 cm. No internal vascularity. No suspicious solid component
identified. This corresponds to one of the masses seen
mammographically.

At 6 o'clock 3 cm from the nipple there is an oval circumscribed
hypoechoic mass measuring 0.7 x 0.3 x 0.5 cm, likely a complicated
cyst. This corresponds to the second mass seen mammographically.
IMPRESSION: Probably benign masses in the left breast at 12 o'clock and 6
o'clock likely representing a cluster of cysts and a complicated
cyst.

RECOMMENDATION:
Left breast ultrasound in 6 months.

I have discussed the findings and recommendations with the patient
who agrees to short-term follow-up. If applicable, a reminder letter
will be sent to the patient regarding the next appointment.

BI-RADS CATEGORY  3: Probably benign.

## 2022-12-07 ENCOUNTER — Encounter: Payer: Self-pay | Admitting: Internal Medicine

## 2022-12-07 ENCOUNTER — Other Ambulatory Visit: Payer: Self-pay | Admitting: Family Medicine

## 2022-12-07 ENCOUNTER — Ambulatory Visit (INDEPENDENT_AMBULATORY_CARE_PROVIDER_SITE_OTHER): Payer: 59 | Admitting: Internal Medicine

## 2022-12-07 VITALS — BP 110/60 | HR 68 | Ht 63.0 in | Wt 154.0 lb

## 2022-12-07 DIAGNOSIS — E038 Other specified hypothyroidism: Secondary | ICD-10-CM

## 2022-12-07 DIAGNOSIS — Z8639 Personal history of other endocrine, nutritional and metabolic disease: Secondary | ICD-10-CM

## 2022-12-07 DIAGNOSIS — E063 Autoimmune thyroiditis: Secondary | ICD-10-CM | POA: Diagnosis not present

## 2022-12-07 LAB — TSH: TSH: 1.15 u[IU]/mL (ref 0.35–5.50)

## 2022-12-07 LAB — T4, FREE: Free T4: 1 ng/dL (ref 0.60–1.60)

## 2022-12-07 NOTE — Progress Notes (Signed)
Patient ID: Sydney Ramsey, female   DOB: March 27, 1980, 42 y.o.   MRN: [id number]  HPI  Sydney Ramsey is a 42 y.o.-year-old female, initially referred by her PCP, 42 y.o.-year-old female, initially referred by her PCP, Dr. Milinda Cave, for history of a thyroid nodule  and Hashimoto's hypothyroidism.  Last visit 6 months ago.  Interim history: She felt more tired in the last 2 weeks. Around that time, she went to [place] in vacation, to visit family and she feels more tired after she returned.  She usually goes to [employer] but she has not been able to exercise consistently since then. She also mentions brittle and slow-growing nails.  Reviewed history: In 2014, pt. developed neck swelling and was found to have a small thyroid nodule.  However, around that time, she was also found to have very high thyroid antibodies and she had a low pulse and blood pressure, also alopecia, and feeling poorly.  She was started levothyroxine by Dr. Sharl Ma and then transition to see Dr. Talmage Nap.   Reviewing the thyroid ultrasound report available in Epic, she was reported to have a 7 mm nodule in 2014, but repeated ultrasounds obtained afterwards did not show a nodule: Thyroid U/S (07/10/2012): Right thyroid lobe:  Measures 5.5 x 1.9 x 1.5 cm and is  heterogeneous in echotexture.  Left thyroid lobe:  Measures 4.0 x 1.1 x 1.7 cm and is  heterogeneous in echotexture.  Isthmus:  Measures 3 mm. Focal nodules:  Possible 7 mm hypoechoic nodule in the right mid pole.   Lymphadenopathy: None visualized.   IMPRESSION:  Very heterogeneous thyroid with a small right thyroid nodule.  Findings do not meet current SRU consensus criteria for biopsy.  Follow-up by clinical exam is recommended.  If patient has known  risk factors for thyroid carcinoma, consider follow-up ultrasound  in 12 months.  If patient is clinically hyperthyroid, consider  nuclear medicine thyroid uptake and scan.   Thyroid U/S (09/17/2013): Right thyroid lobe  Measurements: 4.9 x 1.9 x 1.3 cm. Right thyroid  tissue is diffusely  heterogeneous and similar to the previous examination. No focal  right thyroid nodule.   Left thyroid lobe  Measurements: 3.9 x 1.3 x 1.3 cm. Left thyroid lobe is diffusely  heterogeneous and similar to the previous examination. There is no  discrete thyroid nodule.   Isthmus  Thickness: 0.2 cm.  No nodules visualized.   Lymphadenopathy   Bilateral submandibular lymph nodes measuring 0.5 cm in the short  axis.   IMPRESSION:  The thyroid tissue is very heterogeneous bilaterally and similar to the previous examination. No focal thyroid nodules.   Thyroid U/S (09/18/20219): Parenchymal Echotexture: Moderately heterogenous  Isthmus: 2 mm, unchanged  Right lobe: 4.8 x 1.7 x 1.5 cm, previously 0.9 x 1.9 x 1.3 cm  Left lobe: 4.2 x 1.1 x 1.4 cm, previously 3.9 x 1.3 x 1.3 cm  _________________________________________________________   Estimated total number of nodules >/= 1 cm: 0 _________________________________________________________   Similar moderate gland heterogeneity and nodular mixed echogenicity without significant discrete focal nodule or mass. Normal vascularity. No adenopathy.   IMPRESSION: Stable gland heterogeneity without significant interval change. No significant developing thyroid nodule.  Thyroid U/S (11/29/2020): Parenchymal Echotexture: Moderately heterogenous  Isthmus: 0.2 cm  Right lobe: 4.7 x 1.2 x 1.2 cm  Left lobe: 4.1 x 0.9 x 1.1 cm  Previous dimensions from 11/2017 are similar.  _________________________________________________________   Estimated total number of nodules >/= 1 cm: 0  _________________________________________________________   No discrete nodules are seen within the  thyroid gland.   IMPRESSION: Nonenlarged thyroid gland with heterogeneous echotexture. No discrete nodule identified.  Thyroid U/S (01/02/2022): Parenchymal Echotexture: Mildly heterogenous  Isthmus: 0.1 cm  Right lobe: 4.4 x 1.3 x 1.3 cm   Left lobe: 4.7 x 1.1 x 1.3 cm  _________________________________________________________   Estimated total number of nodules >/= 1 cm: 0 _________________________________________________________   No discrete nodules are seen within the thyroid gland.   IMPRESSION: Mildly heterogeneous thyroid without discrete nodule.  Pt. is on levothyroxine 100 mcg daily, taken: - in am - fasting - at least 30 min from b'fast - + calcium at night - no iron - + multivitamins at lunchtime or evening - no PPIs - not on Biotin  At last visit, we also added selenium in [place] nuts >> off for 2 weeks. Started to feel more tired in the last 2 weeks.  I reviewed pt's thyroid tests: Lab Results  Component Value Date   TSH 0.72 06/05/2022   TSH 1.42 03/06/2022   TSH 1.38 03/04/2021   TSH 0.85 03/04/2020   TSH 1.96 07/16/2017   TSH 1.36 07/12/2016   TSH 0.43 07/03/2013   TSH 0.81 06/13/2013   FREET4 1.04 06/05/2022   T3FREE 2.4 06/05/2022   Antithyroid antibodies: Component     Latest Ref Rng 06/05/2022  Thyroglobulin Ab     < or = 1 IU/mL 1   Thyroperoxidase Ab SerPl-aCnc     <9 IU/mL 56 (H)    She initially had: - fatigue - weight gain (resolved) - cold intolerance at night and heat intolerance during the day - hair loss - brittle nails Which continue now.  Pt denies: - feeling nodules in neck - hoarseness - dysphagia - choking  She has + FH of thyroid disorders in: sister - resolved. No FH of thyroid cancer. No h/o radiation tx to head or neck. No recent use of iodine supplements.  No herbal supplements.  No recent steroid use.  Pt. also has a history of iron deficiency anemia (not since TAH), low back pain.  She is very active - plays pickleball, or [employer], walks the dog.  ROS: Constitutional: + See HPI  Past Medical History:  Diagnosis Date   Abnormal mammogram of both breasts 06/2019   ?asymmetry->diagnostic mammos ok->rpt 06/2020   Capsulitis of foot  2018   L forefoot (Dr. Charlsie Merles).  Orthotics.   Chest pain    stress echo normal 03/2018   Iron deficiency anemia    Hx of; has heavy menses  (low iron/ferritin 06/2013)   Low back pain    L3-L4 ESI 06/03/20 very helpful (Dr Ethelene Hal)   Menorrhagia    [name]   Tachycardia 05/07/2018   Elevated HR noted on her wrist pulse monitor.  Sinus tach on Holter    Thyroid nodule    Has been euthyroid, but Dr. Talmage Nap has had her on levothyroxine suppression.   Past Surgical History:  Procedure Laterality Date   BREAST BIOPSY Right 07/08/2014   Fibroadenoma   BUNIONECTOMY Left    CESAREAN SECTION     exercis stress echo  04/17/2018   NORMAL   HOLTER (24h)  05/07/2018   sinus tachy   LAPAROSCOPIC ASSISTED VAGINAL HYSTERECTOMY  2016   with bilat salpingectomy: ovaries still in.  (done for DUB).   LASIK  2008 approx   Social History   Socioeconomic History   Marital status: Married    Spouse name: Not on file   Number of children: 2  Years of education: Not on file   Highest education level: Not on file  Occupational History   Occupation: SAHM  Tobacco Use   Smoking status: Former    Current packs/day: 0.00    Types: Cigarettes    Quit date: 2009    Years since quitting: 15.7   Smokeless tobacco: Never  Vaping Use   Vaping status: Never Used  Substance and Sexual Activity   Alcohol use: Not Currently    Comment: 2 beers or glasses of wine 2-3x weekly   Drug use: No   Sexual activity: Not on file  Other Topics Concern   Not on file  Social History Narrative   Married, has 1 son and 1 daughter.   Occupation: stay at home mom.   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   Lives in [place].   Tob: 5 pack-yr hx, quit 20009.   Alc: occasional beer.   No drugs.   Exercise: 3-4 days a week : running and/or aerobics.   Social Determinants of Health   Financial Resource Strain: Not on file  Food Insecurity: Not on file  Transportation Needs: Not on file   Physical Activity: Not on file  Stress: Not on file  Social Connections: Not on file  Intimate Partner Violence: Not on file   Current Outpatient Medications on File Prior to Visit  Medication Sig Dispense Refill   buPROPion (WELLBUTRIN XL) 150 MG 24 hr tablet TAKE 1 TABLET BY MOUTH EVERY DAY 30 tablet 5   Calcium-Vitamin D-Vitamin K (CALCIUM SOFT CHEWS PO) Take 1 each by mouth daily.     levothyroxine (SYNTHROID) 100 MCG tablet Take 1 tablet (100 mcg total) by mouth daily before breakfast. 90 tablet 3   scopolamine (TRANSDERM-SCOP, 1.5 MG,) 1 MG/3DAYS Place 1 patch (1.5 mg total) onto the skin every 3 (three) days. 4 patch 1   No current facility-administered medications on file prior to visit.   No Known Allergies Family History  Problem Relation Age of Onset   Alzheimer's disease Paternal Grandmother    Heart attack Paternal Grandfather    Breast cancer Neg Hx    PE: BP 110/60   Pulse 68   Ht 5\' 3"  (1.6 m)   Wt 154 lb (69.9 kg)   LMP 06/17/2013   SpO2 99%   BMI 27.28 kg/m  Wt Readings from Last 3 Encounters:  12/07/22 154 lb (69.9 kg)  06/05/22 153 lb (69.4 kg)  03/06/22 151 lb 9.6 oz (68.8 kg)   Constitutional: normal weight, in NAD Eyes:  EOMI, no exophthalmos ENT: no neck masses, + fullness in bilateral neck, no cervical lymphadenopathy Cardiovascular: RRR, No MRG Respiratory: CTA B Musculoskeletal: no deformities Skin:no rashes Neurological: no tremor with outstretched hands  ASSESSMENT: 1.  Hashimoto's Hypothyroidism  2.  H/o Thyroid nodule  PLAN:  2.  H/o Thyroid nodule -She had a Small 7 mm thyroid nodule observed >10 years ago on the thyroid ultrasound, with subsequent repeated thyroid ultrasounds that do not show nodules only heterogeneity.  -The heterogeneity is indicative of history of thyroid inflammation, most likely in the setting of Hashimoto's thyroiditis -However, a thyroid ultrasound from 2015 showed disappearance of the nodule.  She  continues to have thyroid ultrasound afterwards which confirmed the fact that her thyroid nodule was not visible anymore.  Last ultrasound was from 12/2021. -The previously seen nodule could be related to inflammation (or nodule) -No further ultrasounds are recommended, will continue to follow her clinically  1. Patient with longstanding hypothyroidism, on levothyroxine therapy - at last visit, TPO antibodies were elevated, so we discussed about possibly using selenium 200 mcg daily to help decrease any antibodies. - latest thyroid labs reviewed with pt. >> normal: Lab Results  Component Value Date   TSH 0.72 06/05/2022  - she continues on LT4 100 mcg daily - pt feels good on this dose usually, but in the last 2 weeks she has been more tired, after coming from vacation.  Upon questioning, she was taking 3 [place] nuts a day but she did not take them in vacation and after she returned.  We discussed about restarting these to see if she feels better. - we discussed about taking the thyroid hormone every day, with water, >30 minutes before breakfast, separated by >4 hours from acid reflux medications, calcium, iron, multivitamins. Pt. is taking it correctly. - will check thyroid tests today: TSH and fT4 - If labs are abnormal, she will need to return for repeat TFTs in 1.5 months - OTW, I will see her back in a year  Component     Latest Ref Rng 12/07/2022  TSH     0.35 - 5.50 uIU/mL 1.15   T4,Free(Direct)     0.60 - 1.60 ng/dL 4.09   Normal TFTs.  Carlus Pavlov, MD PhD [facility]

## 2022-12-07 NOTE — Patient Instructions (Addendum)
Please continue Levothyroxine 100 mcg daily.  Take the thyroid hormone every day, with water, at least 30 minutes before breakfast, separated by at least 4 hours from: - acid reflux medications - calcium - iron - multivitamins  Try to restart 3-4 [place] nuts a day.  Please stop at the lab.  You should have an endocrinology follow-up appointment in 1 year.

## 2023-02-14 ENCOUNTER — Ambulatory Visit: Payer: 59 | Admitting: Podiatry

## 2023-02-26 ENCOUNTER — Ambulatory Visit (INDEPENDENT_AMBULATORY_CARE_PROVIDER_SITE_OTHER): Payer: 59 | Admitting: Podiatry

## 2023-02-26 ENCOUNTER — Encounter: Payer: Self-pay | Admitting: Podiatry

## 2023-02-26 ENCOUNTER — Ambulatory Visit (INDEPENDENT_AMBULATORY_CARE_PROVIDER_SITE_OTHER): Payer: 59

## 2023-02-26 VITALS — Ht 63.0 in | Wt 154.0 lb

## 2023-02-26 DIAGNOSIS — M21621 Bunionette of right foot: Secondary | ICD-10-CM

## 2023-02-26 DIAGNOSIS — M21612 Bunion of left foot: Secondary | ICD-10-CM

## 2023-02-26 DIAGNOSIS — M21611 Bunion of right foot: Secondary | ICD-10-CM | POA: Diagnosis not present

## 2023-02-26 NOTE — Progress Notes (Signed)
Subjective:   Patient ID: Sydney Ramsey, female   DOB: 42 y.o.   MRN: [id number]   HPI Patient presents stating her right foot has started to really bother her over the last year and she has tried wider shoes she has tried soaking and padding and she had surgery on the left 1 approximately 15 years ago and this 1 hurts just as much.  Patient does not currently smoke likes to be active   ROS      Objective:  Physical Exam  Neurovascular status intact with patient found to have structural malalignment of the right foot with prominent bunion with redness and pain and prominent fifth metatarsal head redness and pain that have not responded conservatively.  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  Patient is gena do this in January and at this point I allowed her to read consent form for correction of distal metatarsal and fifth metatarsal reviewing distal osteotomy with fixation.  I did go over at great length the consent form and alternative treatments complications and she has tried all alternative treatments and wants surgical intervention and is scheduled for surgery at this time after all questions are answered  X-rays indicate there is elevation of the 1 2 intermetatarsal angle right of approximate 15 degrees and the fifth metatarsal angle is elevated with the left foot showing good alignment from previous surgery.  Air fracture walker dispensed with all instructions on usage walker to get used to it prior to the procedure

## 2023-02-28 ENCOUNTER — Telehealth: Payer: Self-pay | Admitting: Podiatry

## 2023-02-28 NOTE — Telephone Encounter (Signed)
DOS- 03/27/23  Sydney Ramsey [id number] METATARSAL OSTEOTOMY 5TH [id number]  [employer] EFFECTIVE DATE- [birth year]  COINSURANCE- 20%  SPOKE WITH JUDY L. FROM [employer] AND SHE STATED THAT PRIOR AUTH IS NOT REQUIRED FOR CPT CODES 08657 AND 28308.  CALL REFERENCE NUMBER: JUDY L. 02/28/23 @ 10:22AM EST

## 2023-03-04 ENCOUNTER — Other Ambulatory Visit: Payer: Self-pay | Admitting: Family Medicine

## 2023-03-09 ENCOUNTER — Encounter: Payer: Self-pay | Admitting: Family Medicine

## 2023-03-09 ENCOUNTER — Ambulatory Visit (INDEPENDENT_AMBULATORY_CARE_PROVIDER_SITE_OTHER): Payer: 59 | Admitting: Family Medicine

## 2023-03-09 VITALS — BP 107/72 | HR 58 | Temp 98.2°F | Ht 62.0 in | Wt 156.2 lb

## 2023-03-09 DIAGNOSIS — Z0000 Encounter for general adult medical examination without abnormal findings: Secondary | ICD-10-CM

## 2023-03-09 LAB — COMPREHENSIVE METABOLIC PANEL
ALT: 15 U/L (ref 0–35)
AST: 15 U/L (ref 0–37)
Albumin: 4.3 g/dL (ref 3.5–5.2)
Alkaline Phosphatase: 49 U/L (ref 39–117)
BUN: 12 mg/dL (ref 6–23)
CO2: 29 meq/L (ref 19–32)
Calcium: 9.2 mg/dL (ref 8.4–10.5)
Chloride: 104 meq/L (ref 96–112)
Creatinine, Ser: 0.86 mg/dL (ref 0.40–1.20)
GFR: 82.51 mL/min (ref >60.00)
Glucose, Bld: 93 mg/dL (ref 70–99)
Potassium: 4.5 meq/L (ref 3.5–5.1)
Sodium: 140 meq/L (ref 135–145)
Total Bilirubin: 0.6 mg/dL (ref 0.2–1.2)
Total Protein: 6.5 g/dL (ref 6.0–8.3)

## 2023-03-09 LAB — CBC
HCT: 40.7 % (ref 36.0–46.0)
Hemoglobin: 13.5 g/dL (ref 12.0–15.0)
MCHC: 33.2 g/dL (ref 30.0–36.0)
MCV: 91.5 fL (ref 78.0–100.0)
Platelets: 223 10*3/uL (ref 150.0–400.0)
RBC: 4.44 Mil/uL (ref 3.87–5.11)
RDW: 12.7 % (ref 11.5–15.5)
WBC: 6.1 10*3/uL (ref 4.0–10.5)

## 2023-03-09 LAB — LIPID PANEL
Cholesterol: 186 mg/dL (ref 0–200)
HDL: 69.4 mg/dL (ref >39.00)
LDL Cholesterol: 101 mg/dL — ABNORMAL HIGH (ref 0–99)
NonHDL: 116.4
Total CHOL/HDL Ratio: 3
Triglycerides: 75 mg/dL (ref 0.0–149.0)
VLDL: 15 mg/dL (ref 0.0–40.0)

## 2023-03-09 MED ORDER — BUPROPION HCL ER (XL) 150 MG PO TB24: 150.0000 mg | ORAL_TABLET | Freq: Every day | ORAL | 3 refills | Status: AC

## 2023-03-09 MED ORDER — LORAZEPAM 1 MG PO TABS: ORAL_TABLET | ORAL | 0 refills | Status: AC

## 2023-03-09 NOTE — Progress Notes (Signed)
Office Note 03/09/2023  CC:  Chief Complaint  Patient presents with   Annual Exam    Fasting CPE    HPI:  Patient is a 42 y.o. female who is here for annual health maintenance exam.  She is followed by Dr. Elvera Lennox in endocrinology for her hypothyroidism.   Feeling well. Not as active as she would like-->L foot pain and R hip pain lately.  Past Medical History:  Diagnosis Date   Abnormal mammogram of both breasts 06/2019   ?asymmetry->diagnostic mammos ok->rpt 06/2020   Capsulitis of foot 2018   L forefoot (Dr. Charlsie Merles).  Orthotics.   Chest pain    stress echo normal 03/2018   Iron deficiency anemia    Hx of; has heavy menses  (low iron/ferritin 06/2013)   Low back pain    L3-L4 ESI 06/03/20 very helpful (Dr Ethelene Hal)   Menorrhagia    [name]   Tachycardia 05/07/2018   Elevated HR noted on her wrist pulse monitor.  Sinus tach on Holter    Thyroid nodule    Has been euthyroid, but Dr. Talmage Nap has had her on levothyroxine suppression.    Past Surgical History:  Procedure Laterality Date   BREAST BIOPSY Right 07/08/2014   Fibroadenoma   BUNIONECTOMY Left    CESAREAN SECTION     exercis stress echo  04/17/2018   NORMAL   HOLTER (24h)  05/07/2018   sinus tachy   LAPAROSCOPIC ASSISTED VAGINAL HYSTERECTOMY  2016   with bilat salpingectomy: ovaries still in.  (done for DUB).   LASIK  2008 approx    Family History  Problem Relation Age of Onset   Alzheimer's disease Paternal Grandmother    Heart attack Paternal Grandfather    Breast cancer Neg Hx     Social History   Socioeconomic History   Marital status: Married    Spouse name: Not on file   Number of children: 2   Years of education: Not on file   Highest education level: Not on file  Occupational History   Occupation: SAHM  Tobacco Use   Smoking status: Former    Current packs/day: 0.00    Types: Cigarettes    Quit date: 2009    Years since quitting: 15.9   Smokeless tobacco: Never  Vaping Use    Vaping status: Never Used  Substance and Sexual Activity   Alcohol use: Not Currently    Comment: 2 beers or glasses of wine 2-3x weekly   Drug use: No   Sexual activity: Not on file  Other Topics Concern   Not on file  Social History Narrative   Married, has 1 son and 1 daughter.   Occupation: stay at home mom.   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   Lives in [place].   Tob: 5 pack-yr hx, quit 20009.   Alc: occasional beer.   No drugs.   Exercise: 3-4 days a week : running and/or aerobics.   Social Drivers of Corporate investment banker Strain: Not on file  Food Insecurity: Not on file  Transportation Needs: Not on file  Physical Activity: Not on file  Stress: Not on file  Social Connections: Not on file  Intimate Partner Violence: Not on file    Outpatient Medications Prior to Visit  Medication Sig Dispense Refill   Calcium-Vitamin D-Vitamin K (CALCIUM SOFT CHEWS PO) Take 1 each by mouth daily.     levothyroxine (SYNTHROID) 100 MCG tablet Take 1 tablet (  100 mcg total) by mouth daily before breakfast. 90 tablet 3   scopolamine (TRANSDERM-SCOP, 1.5 MG,) 1 MG/3DAYS Place 1 patch (1.5 mg total) onto the skin every 3 (three) days. (Patient not taking: Reported on 03/09/2023) 4 patch 1   buPROPion (WELLBUTRIN XL) 150 MG 24 hr tablet TAKE 1 TABLET (150 MG TOTAL) BY MOUTH DAILY. MUST KEEP APPT FOR FURTHER REFILLS 15 tablet 0   No facility-administered medications prior to visit.    No Known Allergies  Review of Systems  PE;    03/09/2023    9:03 AM 02/26/2023    8:39 AM 12/07/2022    1:03 PM  Vitals with BMI  Height 5\' 2"  5\' 3"  5\' 3"   Weight 156 lbs 3 oz 154 lbs 154 lbs  BMI 28.56 27.29 27.29  Systolic 107  110  Diastolic 72  60  Pulse 58  68   Exam chaperoned by Cloe Motsinger, CMA  Gen: Alert, well appearing.  Patient is oriented to person, place, time, and situation. AFFECT: pleasant, lucid thought and speech. ENT: Ears: EACs clear,  normal epithelium.  TMs with good light reflex and landmarks bilaterally.  Eyes: no injection, icteris, swelling, or exudate.  EOMI, PERRLA. Nose: no drainage or turbinate edema/swelling.  No injection or focal lesion.  Mouth: lips without lesion/swelling.  Oral mucosa pink and moist.  Dentition intact and without obvious caries or gingival swelling.  Oropharynx without erythema, exudate, or swelling.  Neck: supple/nontender.  No LAD, mass, or TM.  Carotid pulses 2+ bilaterally, without bruits. CV: RRR, no m/r/g.   LUNGS: CTA bilat, nonlabored resps, good aeration in all lung fields. ABD: soft, NT, ND, BS normal.  No hepatospenomegaly or mass.  No bruits. EXT: no clubbing, cyanosis, or edema.  Musculoskeletal: no joint swelling, erythema, warmth, or tenderness.  ROM of all joints intact. Skin - no sores or suspicious lesions or rashes or color changes  Pertinent labs:  Lab Results  Component Value Date   TSH 1.15 12/07/2022   Lab Results  Component Value Date   WBC 5.2 03/06/2022   HGB 13.5 03/06/2022   HCT 40.1 03/06/2022   MCV 89.9 03/06/2022   PLT 206.0 03/06/2022   Lab Results  Component Value Date   CREATININE 0.92 03/06/2022   BUN 14 03/06/2022   NA 140 03/06/2022   K 4.6 03/06/2022   CL 106 03/06/2022   CO2 26 03/06/2022   Lab Results  Component Value Date   ALT 15 03/06/2022   AST 14 03/06/2022   ALKPHOS 39 03/06/2022   BILITOT 0.8 03/06/2022   Lab Results  Component Value Date   CHOL 178 03/06/2022   Lab Results  Component Value Date   HDL 57.30 03/06/2022   Lab Results  Component Value Date   LDLCALC 104 (H) 03/06/2022   Lab Results  Component Value Date   TRIG 83.0 03/06/2022   Lab Results  Component Value Date   CHOLHDL 3 03/06/2022   ASSESSMENT AND PLAN:   Health maintenance exam: Reviewed age and gender appropriate health maintenance issues (prudent diet, regular exercise, health risks of tobacco and excessive alcohol, use of seatbelts, fire  alarms in home, use of sunscreen).  Also reviewed age and gender appropriate health screening as well as vaccine recommendations. Vaccines: all UTD Labs: fasting HP today Cervical ca screening: via GYN MD Breast ca screening: Abnormal mammogram in 07/2022.  Due for repeat bilateral diagnostic mammography and left breast ultrasound approximately 07/2023.   Colon ca screening:  average risk patient= as per latest guidelines, start screening at 52 yrs of age.  She has right hip pain recently, followed by Dr. Karen Chafe. He suspects labral tear.  He did steroid injection, pt not sure if it helped much. She is in PT and says it is helping some.  Likely to get MRI soon.  Also, has L foot bunion surgery coming up next month with podiatrist. She has a plane flight soon, is unable to rest on plane d/t her pain, requests sleep aid-->lorazepam 1mg , 1-2-1 every day prn, #2 tabs, no RF  An After Visit Summary was printed and given to the patient.  FOLLOW UP:  Return in about 1 year (around 03/08/2024) for annual CPE (fasting).  Signed:  Santiago Bumpers, MD           03/09/2023

## 2023-03-22 ENCOUNTER — Telehealth: Payer: Self-pay

## 2023-03-22 NOTE — Telephone Encounter (Signed)
 Sydney Ramsey called to cancel her surgery with Dr. Charlsie Merles on 03/27/2023. She stated this is her daughters senior year and they have a lot of activities planned. She stated she will call back to reschedule once her daughter is in college.

## 2023-04-02 ENCOUNTER — Encounter: Payer: 59 | Admitting: Podiatry

## 2023-04-16 ENCOUNTER — Encounter: Payer: 59 | Admitting: Podiatry

## 2023-08-18 ENCOUNTER — Other Ambulatory Visit: Payer: Self-pay | Admitting: Internal Medicine

## 2023-12-07 ENCOUNTER — Ambulatory Visit: Payer: 59 | Admitting: Internal Medicine

## 2024-03-10 ENCOUNTER — Encounter: Payer: 59 | Admitting: Family Medicine

## 2024-03-21 ENCOUNTER — Other Ambulatory Visit: Payer: Self-pay | Admitting: Family Medicine
